# Patient Record
Sex: Male | Born: 1940 | Race: Black or African American | Hispanic: No | Marital: Married | Smoking: Former smoker
Health system: Southern US, Community
[De-identification: ages and names within clinical notes are randomized; demographics above are authoritative.]

## PROBLEM LIST (undated history)

## (undated) DIAGNOSIS — D696 Thrombocytopenia, unspecified: Secondary | ICD-10-CM

## (undated) DIAGNOSIS — E785 Hyperlipidemia, unspecified: Secondary | ICD-10-CM

## (undated) DIAGNOSIS — I10 Essential (primary) hypertension: Secondary | ICD-10-CM

## (undated) DIAGNOSIS — I2699 Other pulmonary embolism without acute cor pulmonale: Secondary | ICD-10-CM

## (undated) HISTORY — DX: Thrombocytopenia, unspecified: D69.6

## (undated) HISTORY — DX: Hyperlipidemia, unspecified: E78.5

## (undated) HISTORY — DX: Other pulmonary embolism without acute cor pulmonale: I26.99

## (undated) HISTORY — DX: Essential (primary) hypertension: I10

---

## 2021-01-29 ENCOUNTER — Other Ambulatory Visit: Payer: Self-pay

## 2021-01-29 ENCOUNTER — Inpatient Hospital Stay (HOSPITAL_COMMUNITY)
Admission: EM | Admit: 2021-01-29 | Discharge: 2021-02-03 | DRG: 175 | Disposition: A | Discharge status: Home / Self Care | Payer: Self-pay | Attending: Student in an Organized Health Care Education/Training Program | Admitting: Student in an Organized Health Care Education/Training Program

## 2021-01-29 ENCOUNTER — Encounter (HOSPITAL_COMMUNITY): Payer: Self-pay | Admitting: Emergency Medicine

## 2021-01-29 ENCOUNTER — Emergency Department (HOSPITAL_COMMUNITY): Payer: Self-pay

## 2021-01-29 DIAGNOSIS — G40909 Epilepsy, unspecified, not intractable, without status epilepticus: Secondary | ICD-10-CM | POA: Diagnosis present

## 2021-01-29 DIAGNOSIS — W109XXA Fall (on) (from) unspecified stairs and steps, initial encounter: Secondary | ICD-10-CM | POA: Diagnosis present

## 2021-01-29 DIAGNOSIS — Z79899 Other long term (current) drug therapy: Secondary | ICD-10-CM

## 2021-01-29 DIAGNOSIS — N179 Acute kidney failure, unspecified: Secondary | ICD-10-CM | POA: Diagnosis present

## 2021-01-29 DIAGNOSIS — M7989 Other specified soft tissue disorders: Secondary | ICD-10-CM | POA: Diagnosis present

## 2021-01-29 DIAGNOSIS — I10 Essential (primary) hypertension: Secondary | ICD-10-CM | POA: Diagnosis present

## 2021-01-29 DIAGNOSIS — B9789 Other viral agents as the cause of diseases classified elsewhere: Secondary | ICD-10-CM | POA: Diagnosis present

## 2021-01-29 DIAGNOSIS — B348 Other viral infections of unspecified site: Secondary | ICD-10-CM | POA: Diagnosis present

## 2021-01-29 DIAGNOSIS — R451 Restlessness and agitation: Secondary | ICD-10-CM | POA: Diagnosis not present

## 2021-01-29 DIAGNOSIS — Z20822 Contact with and (suspected) exposure to covid-19: Secondary | ICD-10-CM | POA: Diagnosis present

## 2021-01-29 DIAGNOSIS — D696 Thrombocytopenia, unspecified: Secondary | ICD-10-CM | POA: Diagnosis present

## 2021-01-29 DIAGNOSIS — M25561 Pain in right knee: Secondary | ICD-10-CM | POA: Diagnosis present

## 2021-01-29 DIAGNOSIS — I071 Rheumatic tricuspid insufficiency: Secondary | ICD-10-CM | POA: Diagnosis present

## 2021-01-29 DIAGNOSIS — I2609 Other pulmonary embolism with acute cor pulmonale: Principal | ICD-10-CM | POA: Diagnosis present

## 2021-01-29 DIAGNOSIS — I2699 Other pulmonary embolism without acute cor pulmonale: Secondary | ICD-10-CM | POA: Diagnosis present

## 2021-01-29 DIAGNOSIS — G9340 Encephalopathy, unspecified: Secondary | ICD-10-CM | POA: Diagnosis present

## 2021-01-29 MED ORDER — NITROGLYCERIN 0.4 MG SL SUBL: 0.4000 mg | SUBLINGUAL_TABLET | SUBLINGUAL | Status: DC | PRN

## 2021-01-29 NOTE — ED Provider Notes (Signed)
[facility] EMERGENCY DEPARTMENT Provider Note   CSN: [id number] Arrival date & time: 01/29/21  2220     History Chief Complaint  Patient presents with   Shortness of Breath   Fall    Jeffrey Valencia is a 80 y.o. male.  The history is provided by the patient, a caregiver and medical records.  Shortness of Breath Fall Associated symptoms include shortness of breath.  Jeffrey Valencia is a 80 y.o. male who presents to the Emergency Department complaining of sob. Level V caveat due to language barrier. He presents the emergency department accompanied by a helper for evaluation of shortness of breath. He was walking down the stairs and fell down the last step. He was feeling well before he fell but as soon as he fell he started complaining of shortness of breath and requesting transport to the hospital. He does take medication but is unsure what it is or what it's for. Denies any recent illnesses. He denies any injuries from the fall.   History reviewed. No pertinent past medical history.  There are no problems to display for this patient.   History reviewed. No pertinent surgical history.     No family history on file.  Social History   Tobacco Use   Smoking status: Never   Smokeless tobacco: Never  Substance Use Topics   Drug use: Never    Home Medications Prior to Admission medications   Not on File    Allergies    Patient has no known allergies.  Review of Systems   Review of Systems  Respiratory:  Positive for shortness of breath.   All other systems reviewed and are negative.  Physical Exam Updated Vital Signs BP 128/86   Pulse 73   Temp 98.8 F (37.1 C) (Oral)   Resp 18   Ht 5\' 7"  (1.702 m)   Wt 81.6 kg   SpO2 94%   BMI 28.19 kg/m   Physical Exam Vitals and nursing note reviewed.  Constitutional:      Appearance: He is well-developed.  HENT:     Head: Normocephalic and atraumatic.  Cardiovascular:     Rate and Rhythm: Normal  rate and regular rhythm.     Heart sounds: No murmur heard. Pulmonary:     Effort: Pulmonary effort is normal. No respiratory distress.     Breath sounds: Normal breath sounds.  Abdominal:     Palpations: Abdomen is soft.     Tenderness: There is no abdominal tenderness. There is no guarding or rebound.  Musculoskeletal:        General: No tenderness.     Comments: 2+ radial and DP pulses bilaterally.  Trace edema to BLE  Skin:    General: Skin is warm and dry.  Neurological:     Mental Status: He is alert and oriented to person, place, and time.  Psychiatric:        Behavior: Behavior normal.    ED Results / Procedures / Treatments   Labs (all labs ordered are listed, but only abnormal results are displayed) Labs Reviewed  CBC WITH DIFFERENTIAL/PLATELET - Abnormal; Notable for the following components:      Result Value   RDW 16.7 (*)    Platelets 100 (*)    nRBC 0.3 (*)    All other components within normal limits  COMPREHENSIVE METABOLIC PANEL - Abnormal; Notable for the following components:   Glucose, Bld 205 (*)    Creatinine, Ser 1.67 (*)  GFR, Estimated 42 (*)    All other components within normal limits  BRAIN NATRIURETIC PEPTIDE - Abnormal; Notable for the following components:   B Natriuretic Peptide 164.1 (*)    All other components within normal limits  D-DIMER, QUANTITATIVE - Abnormal; Notable for the following components:   D-Dimer, Quant 8.56 (*)    All other components within normal limits  I-STAT CHEM 8, ED - Abnormal; Notable for the following components:   Creatinine, Ser 1.60 (*)    Glucose, Bld 136 (*)    All other components within normal limits  TROPONIN I (HIGH SENSITIVITY) - Abnormal; Notable for the following components:   Troponin I (High Sensitivity) 247 (*)    All other components within normal limits  TROPONIN I (HIGH SENSITIVITY) - Abnormal; Notable for the following components:   Troponin I (High Sensitivity) 271 (*)    All other  components within normal limits  RESP PANEL BY RT-PCR (FLU A&B, COVID) [id number]  PROTIME-INR  HEPARIN LEVEL (UNFRACTIONATED)    EKG EKG Interpretation  Date/Time:  Thursday January 29 2021 23:00:49 EDT Ventricular Rate:  90 PR Interval:  184 QRS Duration: 80 QT Interval:  358 QTC Calculation: 437 R Axis:   71 Text Interpretation: Normal sinus rhythm Nonspecific T wave abnormality Abnormal ECG Confirmed by Tilden Fossaees, Ismar Yabut [phone]([id number]) on 01/29/2021 11:24:59 PM  Radiology CT HEAD WO CONTRAST (5MM)  Result Date: 01/30/2021 CLINICAL DATA:  Head and neck trauma EXAM: CT HEAD WITHOUT CONTRAST CT CERVICAL SPINE WITHOUT CONTRAST TECHNIQUE: Multidetector CT imaging of the head and cervical spine was performed following the standard protocol without intravenous contrast. Multiplanar CT image reconstructions of the cervical spine were also generated. COMPARISON:  None. FINDINGS: CT HEAD FINDINGS Brain: There is no mass, hemorrhage or extra-axial collection. The size and configuration of the ventricles and extra-axial CSF spaces are normal. There is hypoattenuation of the periventricular white matter, most commonly indicating chronic ischemic microangiopathy. Vascular: No abnormal hyperdensity of the major intracranial arteries or dural venous sinuses. No intracranial atherosclerosis. Skull: The visualized skull base, calvarium and extracranial soft tissues are normal. Sinuses/Orbits: No fluid levels or advanced mucosal thickening of the visualized paranasal sinuses. No mastoid or middle ear effusion. The orbits are normal. CT CERVICAL SPINE FINDINGS Alignment: No static subluxation. Facets are aligned. Occipital condyles are normally positioned. Skull base and vertebrae: No acute fracture. Soft tissues and spinal canal: No prevertebral fluid or swelling. No visible canal hematoma. Disc levels: No advanced spinal canal or neural foraminal stenosis. Upper chest: No pneumothorax, pulmonary nodule or pleural effusion.  Other: Normal visualized paraspinal cervical soft tissues. IMPRESSION: 1. Chronic ischemic microangiopathy without acute intracranial abnormality. 2. No acute fracture or static subluxation of the cervical spine. Electronically Signed   By: Deatra RobinsonKevin  Herman M.D.   On: 01/30/2021 02:29   CT Angio Chest PE W/Cm &/Or Wo Cm  Result Date: 01/30/2021 CLINICAL DATA:  Positive D-dimer EXAM: CT ANGIOGRAPHY CHEST WITH CONTRAST TECHNIQUE: Multidetector CT imaging of the chest was performed using the standard protocol during bolus administration of intravenous contrast. Multiplanar CT image reconstructions and MIPs were obtained to evaluate the vascular anatomy. CONTRAST:  75mL OMNIPAQUE IOHEXOL 350 MG/ML SOLN COMPARISON:  None. FINDINGS: Cardiovascular:  Normal heart size.  No pericardial effusion. Multiple segmental and subsegmental pulmonary artery filling defects, some being occlusive including at the bilateral upper lobes. RV to LV ratio is over 1, although possibly accentuated by hypertrophic appearing left ventricle. Dilated main pulmonary artery at 33 mm. No acute  aortic finding. Mediastinum/Nodes: Negative for adenopathy or mass Lungs/Pleura: Airspace opacity which is patchy in the left lung more than right lung, not typical of pulmonary infarct and more suspicious for superimposed pneumonia. 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 No edema or effusion. Upper Abdomen: No acute finding Musculoskeletal: No acute finding Review of the MIP images confirms the above findings. Critical Value/emergent results were called by telephone at the time of interpretation on 01/30/2021 at 6:21 am to provider [facility] , who verbally acknowledged these results. IMPRESSION: Pan lobar acute pulmonary emboli with occlusive segmental clots. CT evidence of right heart strain consistent with at least submassive (intermediate risk) PE. The presence of right heart strain has been associated with an increased risk of  morbidity and mortality. Please refer to the "PE Focused" order set in EPIC. Airspace opacity in the left lung favoring pneumonia. Electronically Signed   By: Marnee Spring M.D.   On: 01/30/2021 06:23   CT CERVICAL SPINE WO CONTRAST  Result Date: 01/30/2021 CLINICAL DATA:  Head and neck trauma EXAM: CT HEAD WITHOUT CONTRAST CT CERVICAL SPINE WITHOUT CONTRAST TECHNIQUE: Multidetector CT imaging of the head and cervical spine was performed following the standard protocol without intravenous contrast. Multiplanar CT image reconstructions of the cervical spine were also generated. COMPARISON:  None. FINDINGS: CT HEAD FINDINGS Brain: There is no mass, hemorrhage or extra-axial collection. The size and configuration of the ventricles and extra-axial CSF spaces are normal. There is hypoattenuation of the periventricular white matter, most commonly indicating chronic ischemic microangiopathy. Vascular: No abnormal hyperdensity of the major intracranial arteries or dural venous sinuses. No intracranial atherosclerosis. Skull: The visualized skull base, calvarium and extracranial soft tissues are normal. Sinuses/Orbits: No fluid levels or advanced mucosal thickening of the visualized paranasal sinuses. No mastoid or middle ear effusion. The orbits are normal. CT CERVICAL SPINE FINDINGS Alignment: No static subluxation. Facets are aligned. Occipital condyles are normally positioned. Skull base and vertebrae: No acute fracture. Soft tissues and spinal canal: No prevertebral fluid or swelling. No visible canal hematoma. Disc levels: No advanced spinal canal or neural foraminal stenosis. Upper chest: No pneumothorax, pulmonary nodule or pleural effusion. Other: Normal visualized paraspinal cervical soft tissues. IMPRESSION: 1. Chronic ischemic microangiopathy without acute intracranial abnormality. 2. No acute fracture or static subluxation of the cervical spine. Electronically Signed   By: Deatra Robinson M.D.   On:  01/30/2021 02:29   DG Chest Port 1 View  Result Date: 01/30/2021 CLINICAL DATA:  Chest pain and shortness of breath. EXAM: PORTABLE CHEST 1 VIEW COMPARISON:  None. FINDINGS: Faint left perihilar hazy density may represent mild vascular congestion or asymmetric edema versus developing infiltrate. Clinical correlation is recommended. No lobar consolidation, pleural effusion or pneumothorax. Borderline cardiomegaly. Atherosclerotic calcification of the aortic arch. No acute osseous pathology. IMPRESSION: Mild left perihilar hazy density may represent mild vascular congestion or asymmetric edema versus developing infiltrate. Electronically Signed   By: Elgie Collard M.D.   On: 01/30/2021 00:14    Procedures Procedures  CRITICAL CARE Performed by: Tilden Fossa   Total critical care time: 45 minutes  Critical care time was exclusive of separately billable procedures and treating other patients.  Critical care was necessary to treat or prevent imminent or life-threatening deterioration.  Critical care was time spent personally by me on the following activities: development of treatment plan with patient and/or surrogate as well as nursing, discussions with consultants, evaluation of patient's response to treatment, examination of patient, obtaining history from  patient or surrogate, ordering and performing treatments and interventions, ordering and review of laboratory studies, ordering and review of radiographic studies, pulse oximetry and re-evaluation of patient's condition.  Medications Ordered in ED Medications  nitroGLYCERIN (NITROSTAT) SL tablet 0.4 mg (has no administration in time range)  heparin ADULT infusion 100 units/mL (25000 units/215mL) (1,000 Units/hr Intravenous New Bag/Given 01/30/21 0606)  aspirin chewable tablet 324 mg (324 mg Oral Given 01/30/21 0607)  heparin bolus via infusion 4,000 Units (4,000 Units Intravenous Bolus from Bag 01/30/21 0606)  LORazepam (ATIVAN) injection 0.5  mg (0.5 mg Intravenous Given 01/30/21 0537)  iohexol (OMNIPAQUE) 350 MG/ML injection 75 mL (75 mLs Intravenous Contrast Given 01/30/21 0607)    ED Course  I have reviewed the triage vital signs and the nursing notes.  Pertinent labs & imaging results that were available during my care of the patient were reviewed by me and considered in my medical decision making (see chart for details).    MDM Rules/Calculators/A&P                         Pt here for evaluation of sob that started when he fell down the stairs. EKG with nonspecific changes, no priors available for comparison. Troponin's are mildly elevated. Concern for PE and a CTA was obtained. CTA demonstrates large PE with evidence of right heart strain. Patient was treated with heparin drip. Discussed with patient and friend findings of studies recommendation for mission and they are in agreement with plan. Medicine consulted formation.   Final Clinical Impression(s) / ED Diagnoses Final diagnoses:  Other acute pulmonary embolism with acute cor pulmonale [facility])    Rx / DC Orders ED Discharge Orders     None        Tilden Fossa, MD 01/30/21 0725

## 2021-01-29 NOTE — ED Triage Notes (Signed)
Pt arrive POV from home with family for c/o increase SOB for the past few days and a fall today, pta.

## 2021-01-29 NOTE — ED Provider Notes (Signed)
Emergency Medicine Provider Triage Evaluation Note  Jeffrey Valencia , a 80 y.o. male  was evaluated in triage.  Pt complains of fall.  History is provided by family due to language barrier.  The patient fell down a flight of stairs earlier today.  His son reports that there was no loss of consciousness.  The patient states that he fell because he was a very short of breath.  The son reports that he has been increasingly short of breath and breathing faster over the last 5 days.  Patient is also endorsing chest pain right knee pain, and neck pain.  He is unsure if he takes blood thinners.  Review of Systems  Positive: Chest pain, neck pain, shortness of breath, arthralgias, myalgias Negative: Fever, chills, syncope, vomiting, rash  Physical Exam  BP (!) 149/74   Pulse 88   Temp 98.8 F (37.1 C) (Oral)   Resp 18   SpO2 100%  Gen:   Awake, no distress   Resp:  Normal effort  MSK:   Moves extremities without difficulty  Other:  Diffusely tender to palpation to the right knee.  No obvious trauma noted to the head.  No crepitus or step-offs.  Mild tachypnea.  Medical Decision Making  Medically screening exam initiated at 11:05 PM.  Appropriate orders placed.  Jeffrey Valencia was informed that the remainder of the evaluation will be completed by another provider, this initial triage assessment does not replace that evaluation, and the importance of remaining in the ED until their evaluation is complete.  Patient will require further work-up and evaluation in the emergency department.  EKG with ST segment changes.  He will be taken back to a room from triage.   Frederik Pear A, PA-C 01/29/21 2310    Derwood Kaplan, MD 01/30/21 [phone]

## 2021-01-30 ENCOUNTER — Inpatient Hospital Stay (HOSPITAL_COMMUNITY): Payer: Self-pay

## 2021-01-30 ENCOUNTER — Emergency Department (HOSPITAL_COMMUNITY): Payer: Self-pay

## 2021-01-30 ENCOUNTER — Observation Stay (HOSPITAL_COMMUNITY): Payer: Self-pay

## 2021-01-30 ENCOUNTER — Encounter (HOSPITAL_COMMUNITY): Payer: Self-pay | Admitting: Student in an Organized Health Care Education/Training Program

## 2021-01-30 DIAGNOSIS — I2609 Other pulmonary embolism with acute cor pulmonale: Secondary | ICD-10-CM

## 2021-01-30 DIAGNOSIS — I10 Essential (primary) hypertension: Secondary | ICD-10-CM | POA: Diagnosis present

## 2021-01-30 DIAGNOSIS — I2699 Other pulmonary embolism without acute cor pulmonale: Secondary | ICD-10-CM | POA: Diagnosis present

## 2021-01-30 LAB — CBC WITH DIFFERENTIAL/PLATELET
Abs Immature Granulocytes: 0.04 10*3/uL (ref 0.00–0.07)
Basophils Absolute: 0 10*3/uL (ref 0.0–0.1)
Basophils Relative: 1 %
Eosinophils Absolute: 0.2 10*3/uL (ref 0.0–0.5)
Eosinophils Relative: 2 %
HCT: 40.2 % (ref 39.0–52.0)
Hemoglobin: 13.3 g/dL (ref 13.0–17.0)
Immature Granulocytes: 1 %
Lymphocytes Relative: 25 %
Lymphs Abs: 2.1 10*3/uL (ref 0.7–4.0)
MCH: 29.2 pg (ref 26.0–34.0)
MCHC: 33.1 g/dL (ref 30.0–36.0)
MCV: 88.2 fL (ref 80.0–100.0)
Monocytes Absolute: 0.8 10*3/uL (ref 0.1–1.0)
Monocytes Relative: 10 %
Neutro Abs: 5.2 10*3/uL (ref 1.7–7.7)
Neutrophils Relative %: 61 %
Platelets: 100 10*3/uL — ABNORMAL LOW (ref 150–400)
RBC: 4.56 MIL/uL (ref 4.22–5.81)
RDW: 16.7 % — ABNORMAL HIGH (ref 11.5–15.5)
WBC: 8 10*3/uL (ref 4.0–10.5)
nRBC: 0.3 % — ABNORMAL HIGH (ref 0.0–0.2)

## 2021-01-30 LAB — COMPREHENSIVE METABOLIC PANEL
ALT: 22 U/L (ref 0–44)
AST: 27 U/L (ref 15–41)
Albumin: 3.5 g/dL (ref 3.5–5.0)
Alkaline Phosphatase: 89 U/L (ref 38–126)
Anion gap: 8 (ref 5–15)
BUN: 22 mg/dL (ref 8–23)
CO2: 24 mmol/L (ref 22–32)
Calcium: 9.3 mg/dL (ref 8.9–10.3)
Chloride: 103 mmol/L (ref 98–111)
Creatinine, Ser: 1.67 mg/dL — ABNORMAL HIGH (ref 0.61–1.24)
GFR, Estimated: 42 mL/min — ABNORMAL LOW (ref >60)
Glucose, Bld: 205 mg/dL — ABNORMAL HIGH (ref 70–99)
Potassium: 4 mmol/L (ref 3.5–5.1)
Sodium: 135 mmol/L (ref 135–145)
Total Bilirubin: 0.9 mg/dL (ref 0.3–1.2)
Total Protein: 7.6 g/dL (ref 6.5–8.1)

## 2021-01-30 LAB — STREP PNEUMONIAE URINARY ANTIGEN: Strep Pneumo Urinary Antigen: NEGATIVE

## 2021-01-30 LAB — RESPIRATORY PANEL BY PCR

## 2021-01-30 LAB — ECHOCARDIOGRAM COMPLETE
AR max vel: 2.31 cm2
AV Area VTI: 2.65 cm2
AV Area mean vel: 2.44 cm2
AV Mean grad: 2 mmHg
AV Peak grad: 3.5 mmHg
Ao pk vel: 0.94 m/s
Area-P 1/2: 1.93 cm2
Height: 67 in
S' Lateral: 2.4 cm
Weight: 2880 oz

## 2021-01-30 LAB — HEPARIN LEVEL (UNFRACTIONATED)
Heparin Unfractionated: 0.53 IU/mL (ref 0.30–0.70)
Heparin Unfractionated: 0.35 IU/mL (ref 0.30–0.70)

## 2021-01-30 LAB — RESP PANEL BY RT-PCR (FLU A&B, COVID) ARPGX2
Influenza A by PCR: NEGATIVE
Influenza B by PCR: NEGATIVE
SARS Coronavirus 2 by RT PCR: NEGATIVE

## 2021-01-30 LAB — I-STAT CHEM 8, ED
BUN: 23 mg/dL (ref 8–23)
Calcium, Ion: 1.21 mmol/L (ref 1.15–1.40)
Chloride: 105 mmol/L (ref 98–111)
Creatinine, Ser: 1.6 mg/dL — ABNORMAL HIGH (ref 0.61–1.24)
Glucose, Bld: 136 mg/dL — ABNORMAL HIGH (ref 70–99)
HCT: 44 % (ref 39.0–52.0)
Hemoglobin: 15 g/dL (ref 13.0–17.0)
Potassium: 4.1 mmol/L (ref 3.5–5.1)
Sodium: 140 mmol/L (ref 135–145)
TCO2: 25 mmol/L (ref 22–32)

## 2021-01-30 LAB — URINALYSIS, ROUTINE W REFLEX MICROSCOPIC
Bilirubin Urine: NEGATIVE
Glucose, UA: NEGATIVE mg/dL
Hgb urine dipstick: NEGATIVE
Ketones, ur: NEGATIVE mg/dL
Leukocytes,Ua: NEGATIVE
Nitrite: NEGATIVE
Protein, ur: NEGATIVE mg/dL
Specific Gravity, Urine: 1.045 — ABNORMAL HIGH (ref 1.005–1.030)
pH: 7 (ref 5.0–8.0)

## 2021-01-30 LAB — PROTIME-INR
INR: 1.1 (ref 0.8–1.2)
Prothrombin Time: 13.9 seconds (ref 11.4–15.2)

## 2021-01-30 LAB — D-DIMER, QUANTITATIVE: D-Dimer, Quant: 8.56 ug/mL-FEU — ABNORMAL HIGH (ref 0.00–0.50)

## 2021-01-30 LAB — TROPONIN I (HIGH SENSITIVITY)
Troponin I (High Sensitivity): 103 ng/L (ref <18)
Troponin I (High Sensitivity): 247 ng/L (ref <18)
Troponin I (High Sensitivity): 271 ng/L (ref <18)

## 2021-01-30 LAB — PROCALCITONIN: Procalcitonin: <0.1 ng/mL

## 2021-01-30 LAB — BRAIN NATRIURETIC PEPTIDE: B Natriuretic Peptide: 164.1 pg/mL — ABNORMAL HIGH (ref 0.0–100.0)

## 2021-01-30 MED ORDER — HEPARIN BOLUS VIA INFUSION
4000.0000 [IU] | Freq: Once | INTRAVENOUS | Status: AC
  Administered 2021-01-30: 4000 [IU] via INTRAVENOUS
  Filled 2021-01-30: qty 4000

## 2021-01-30 MED ORDER — ACETAMINOPHEN 650 MG RE SUPP: 650.0000 mg | Freq: Four times a day (QID) | RECTAL | Status: DC | PRN

## 2021-01-30 MED ORDER — ACETAMINOPHEN 325 MG PO TABS
650.0000 mg | ORAL_TABLET | Freq: Four times a day (QID) | ORAL | Status: DC | PRN
  Administered 2021-01-30: 650 mg via ORAL
  Filled 2021-01-30: qty 2

## 2021-01-30 MED ORDER — ASPIRIN 81 MG PO CHEW
324.0000 mg | CHEWABLE_TABLET | Freq: Once | ORAL | Status: AC
  Administered 2021-01-30: 324 mg via ORAL
  Filled 2021-01-30: qty 4

## 2021-01-30 MED ORDER — IOHEXOL 350 MG/ML SOLN
75.0000 mL | Freq: Once | INTRAVENOUS | Status: AC | PRN
  Administered 2021-01-30: 75 mL via INTRAVENOUS

## 2021-01-30 MED ORDER — LORAZEPAM 2 MG/ML IJ SOLN: 0.5000 mg | Freq: Once | INTRAMUSCULAR | Status: DC

## 2021-01-30 MED ORDER — HALOPERIDOL LACTATE 5 MG/ML IJ SOLN
5.0000 mg | Freq: Once | INTRAMUSCULAR | Status: AC
  Administered 2021-01-30: 5 mg via INTRAMUSCULAR
  Filled 2021-01-30: qty 1

## 2021-01-30 MED ORDER — SODIUM CHLORIDE 0.9 % IV SOLN
1.0000 g | INTRAVENOUS | Status: DC
  Administered 2021-01-30: 1 g via INTRAVENOUS
  Filled 2021-01-30 (×2): qty 10

## 2021-01-30 MED ORDER — LORAZEPAM 2 MG/ML IJ SOLN
0.5000 mg | Freq: Once | INTRAMUSCULAR | Status: AC
  Administered 2021-01-30: 0.5 mg via INTRAMUSCULAR
  Filled 2021-01-30: qty 1

## 2021-01-30 MED ORDER — HEPARIN (PORCINE) 25000 UT/250ML-% IV SOLN
1300.0000 [IU]/h | INTRAVENOUS | Status: DC
  Administered 2021-01-30: 1300 [IU]/h via INTRAVENOUS
  Administered 2021-01-30: 1000 [IU]/h via INTRAVENOUS
  Administered 2021-01-31: 1300 [IU]/h via INTRAVENOUS
  Filled 2021-01-30 (×3): qty 250

## 2021-01-30 MED ORDER — SODIUM CHLORIDE 0.9 % IV SOLN
500.0000 mg | Freq: Once | INTRAVENOUS | Status: AC
  Administered 2021-01-30: 500 mg via INTRAVENOUS
  Filled 2021-01-30: qty 500

## 2021-01-30 MED ORDER — LORAZEPAM 2 MG/ML IJ SOLN
0.5000 mg | Freq: Once | INTRAMUSCULAR | Status: AC
  Administered 2021-01-30: 0.5 mg via INTRAVENOUS
  Filled 2021-01-30: qty 1

## 2021-01-30 NOTE — ED Notes (Signed)
Patient transported to ultrasound at this time.

## 2021-01-30 NOTE — ED Notes (Signed)
Pt noted to be out of bed and using urinal, O2 disconnected from wall.  Pt notified that he could not get out of bed without staff.  That it was dangerous.  Pt's son had also been notified, but stated pt did not want to stay in bed.  MD notified and is speaking with pt.  MD also notified of pt c/o bil leg pain, R greater than L.

## 2021-01-30 NOTE — ED Notes (Signed)
Pt refusing to keep oxygen on.

## 2021-01-30 NOTE — Consult Note (Signed)
Chief Complaint: SOB  Referring Physician(s): Dr. Madilyn Hookees  Supervising Physician: Gilmer MorWagner, Daneesha Quinteros  Patient Status: [facility]  History of Present Illness: Jeffrey KnudsenDaniel Valencia is a 80 y.o. male presenting [facility] ED at [facility] with worsening SOB over the past few days, and a fall today secondary to his SOB.   He was diagnosed with PE today, and currently is on heparin.  VIR was contacted to evaluate for possible intervention.   Note that nearly all of the history is established by the medical record, with some from the patient's associate in the room ("I am just helping him."), as the patient has recently arrived from [place] for personal/family reason, and speaks dialect [name].  Apparently we have a lack of translation options for this dialect.   History negative for syncope or presyncope.    CTA was performed confirming PE, involving segmental/subsegmental arteries of bilateral lungs.  The RV/LV was reported as "over 1".  The measurements provided on the CTA give ratio of 1.5.  ECHO was performed demonstrating right ventricle dysfunction with estimated right ventricle pressure of 58mmHg.  EKG without bundle block.    Trop I is elevated: 247 >> 271 BNP is elevated: 164  Vital signs in the room:   SBP 148, HR: 70-80, O2 is 97% on 2L   I cannot ascertain if he has any history of cancer, or underlying CV disease.  Without, his sPESI is 0.    He endorses chest discomfort, particularly with moving and with deep breathing. Endorses bilateral calf pain.   He did, after asking any questions, ask if we can do anything for a painful tooth at the right lower molar region.  Also, he said that he has been "urinating a lot." Denies blood and denies any pain with urination.   History reviewed. No pertinent past medical history.  History reviewed. No pertinent surgical history.  Allergies: Patient has no known allergies.  Medications: Prior to Admission medications   Not on File     No family history  on file.  Social History   Socioeconomic History   Marital status: Married    Spouse name: Not on file   Number of children: Not on file   Years of education: Not on file   Highest education level: Not on file  Occupational History   Not on file  Tobacco Use   Smoking status: Never   Smokeless tobacco: Never  Substance and Sexual Activity   Alcohol use: Not on file   Drug use: Never   Sexual activity: Never  Other Topics Concern   Not on file  Social History Narrative   Not on file   Social Determinants of Health   Financial Resource Strain: Not on file  Food Insecurity: Not on file  Transportation Needs: Not on file  Physical Activity: Not on file  Stress: Not on file  Social Connections: Not on file       Review of Systems: A 12 point ROS discussed and pertinent positives are indicated in the HPI above.  All other systems are negative.  Review of Systems  Vital Signs: BP (!) 148/96   Pulse 77   Temp 97.7 F (36.5 C) (Oral)   Resp (!) 25   Ht 5\' 7"  (1.702 m)   Wt 81.6 kg   SpO2 93%   BMI 28.19 kg/m   Physical Exam General: 80 yo male appearing stated age.  Limited english understanding, though speaks/understand some.  Well-developed, well-nourished.  No distress.  HEENT: Atraumatic, normocephalic.  Conjugate gaze, extra-ocular motor intact. Mild scleral injection. No lesions on external ears, nose, lips, or gums.  Poor dentition.  Tacky mucosa.  Neck: Symmetric with no goiter enlargement.  Chest/Lungs:  Symmetric chest with inspiration/expiration.  Breathing comfortably at this time.     Heart:  No JVD appreciated.  Abdomen:  Soft, NT/ND, with + bowel sounds.   Genito-urinary: Deferred Neurologic: Cannot assess mental status well given language barrier. Moving all 4 extremities with gross sensory intact.  Pulse Exam:  palpable CFA pules.  Extremities  warm with No wound.  No significant swelling of the LE.   Imaging: CT HEAD WO CONTRAST ( )  Result  Date: 01/30/2021 CLINICAL DATA:  Head and neck trauma EXAM: CT HEAD WITHOUT CONTRAST CT CERVICAL SPINE WITHOUT CONTRAST TECHNIQUE: Multidetector CT imaging of the head and cervical spine was performed following the standard protocol without intravenous contrast. Multiplanar CT image reconstructions of the cervical spine were also generated. COMPARISON:  None. FINDINGS: CT HEAD FINDINGS Brain: There is no mass, hemorrhage or extra-axial collection. The size and configuration of the ventricles and extra-axial CSF spaces are normal. There is hypoattenuation of the periventricular white matter, most commonly indicating chronic ischemic microangiopathy. Vascular: No abnormal hyperdensity of the major intracranial arteries or dural venous sinuses. No intracranial atherosclerosis. Skull: The visualized skull base, calvarium and extracranial soft tissues are normal. Sinuses/Orbits: No fluid levels or advanced mucosal thickening of the visualized paranasal sinuses. No mastoid or middle ear effusion. The orbits are normal. CT CERVICAL SPINE FINDINGS Alignment: No static subluxation. Facets are aligned. Occipital condyles are normally positioned. Skull base and vertebrae: No acute fracture. Soft tissues and spinal canal: No prevertebral fluid or swelling. No visible canal hematoma. Disc levels: No advanced spinal canal or neural foraminal stenosis. Upper chest: No pneumothorax, pulmonary nodule or pleural effusion. Other: Normal visualized paraspinal cervical soft tissues. IMPRESSION: 1. Chronic ischemic microangiopathy without acute intracranial abnormality. 2. No acute fracture or static subluxation of the cervical spine. Electronically Signed   By: Deatra Robinson M.D.   On: 01/30/2021 02:29   CT Angio Chest PE W/Cm &/Or Wo Cm  Result Date: 01/30/2021 CLINICAL DATA:  Positive D-dimer EXAM: CT ANGIOGRAPHY CHEST WITH CONTRAST TECHNIQUE: Multidetector CT imaging of the chest was performed using the standard protocol during  bolus administration of intravenous contrast. Multiplanar CT image reconstructions and MIPs were obtained to evaluate the vascular anatomy. CONTRAST:  65mL OMNIPAQUE IOHEXOL 350 MG/ML SOLN COMPARISON:  None. FINDINGS: Cardiovascular:  Normal heart size.  No pericardial effusion. Multiple segmental and subsegmental pulmonary artery filling defects, some being occlusive including at the bilateral upper lobes. RV to LV ratio is over 1, although possibly accentuated by hypertrophic appearing left ventricle. Dilated main pulmonary artery at 33 mm. No acute aortic finding. Mediastinum/Nodes: Negative for adenopathy or mass Lungs/Pleura: Airspace opacity which is patchy in the left lung more than right lung, not typical of pulmonary infarct and more suspicious for superimposed pneumonia. 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 No edema or effusion. Upper Abdomen: No acute finding Musculoskeletal: No acute finding Review of the MIP images confirms the above findings. Critical Value/emergent results were called by telephone at the time of interpretation on 01/30/2021 at 6:21 am to provider [facility] , who verbally acknowledged these results. IMPRESSION: Pan lobar acute pulmonary emboli with occlusive segmental clots. CT evidence of right heart strain consistent with at least submassive (intermediate risk) PE. The presence of right heart strain has been  associated with an increased risk of morbidity and mortality. Please refer to the "PE Focused" order set in EPIC. Airspace opacity in the left lung favoring pneumonia. Electronically Signed   By: Marnee Spring M.D.   On: 01/30/2021 06:23   CT CERVICAL SPINE WO CONTRAST  Result Date: 01/30/2021 CLINICAL DATA:  Head and neck trauma EXAM: CT HEAD WITHOUT CONTRAST CT CERVICAL SPINE WITHOUT CONTRAST TECHNIQUE: Multidetector CT imaging of the head and cervical spine was performed following the standard protocol without intravenous contrast. Multiplanar CT  image reconstructions of the cervical spine were also generated. COMPARISON:  None. FINDINGS: CT HEAD FINDINGS Brain: There is no mass, hemorrhage or extra-axial collection. The size and configuration of the ventricles and extra-axial CSF spaces are normal. There is hypoattenuation of the periventricular white matter, most commonly indicating chronic ischemic microangiopathy. Vascular: No abnormal hyperdensity of the major intracranial arteries or dural venous sinuses. No intracranial atherosclerosis. Skull: The visualized skull base, calvarium and extracranial soft tissues are normal. Sinuses/Orbits: No fluid levels or advanced mucosal thickening of the visualized paranasal sinuses. No mastoid or middle ear effusion. The orbits are normal. CT CERVICAL SPINE FINDINGS Alignment: No static subluxation. Facets are aligned. Occipital condyles are normally positioned. Skull base and vertebrae: No acute fracture. Soft tissues and spinal canal: No prevertebral fluid or swelling. No visible canal hematoma. Disc levels: No advanced spinal canal or neural foraminal stenosis. Upper chest: No pneumothorax, pulmonary nodule or pleural effusion. Other: Normal visualized paraspinal cervical soft tissues. IMPRESSION: 1. Chronic ischemic microangiopathy without acute intracranial abnormality. 2. No acute fracture or static subluxation of the cervical spine. Electronically Signed   By: Deatra Robinson M.D.   On: 01/30/2021 02:29   US RENAL  Result Date: 01/30/2021 CLINICAL DATA:  Acute kidney injury EXAM: RENAL / URINARY TRACT ULTRASOUND COMPLETE COMPARISON:  None. FINDINGS: Right Kidney: Renal measurements: 9.0 x 4.8 x 5.1 cm = volume: 116 mL. Echogenicity within normal limits. No mass or hydronephrosis visualized. Left Kidney: Renal measurements: 8.2 x 5.3 x 5.4 cm = volume: 122 mL. Echogenicity within normal limits. No mass or hydronephrosis visualized. Bladder: Appears normal for degree of bladder distention. Other: None.  IMPRESSION: Unremarkable sonographic evaluation of the kidneys. Electronically Signed   By: Acquanetta Belling M.D.   On: 01/30/2021 15:52   DG Chest Port 1 View  Result Date: 01/30/2021 CLINICAL DATA:  Chest pain and shortness of breath. EXAM: PORTABLE CHEST 1 VIEW COMPARISON:  None. FINDINGS: Faint left perihilar hazy density may represent mild vascular congestion or asymmetric edema versus developing infiltrate. Clinical correlation is recommended. No lobar consolidation, pleural effusion or pneumothorax. Borderline cardiomegaly. Atherosclerotic calcification of the aortic arch. No acute osseous pathology. IMPRESSION: Mild left perihilar hazy density may represent mild vascular congestion or asymmetric edema versus developing infiltrate. Electronically Signed   By: Elgie Collard M.D.   On: 01/30/2021 00:14   ECHOCARDIOGRAM COMPLETE  Result Date: 01/30/2021    ECHOCARDIOGRAM LIMITED REPORT   Patient Name:   Jeffrey Valencia Date of Exam: 01/30/2021 Medical Rec #:  [id number]    Height:       67.0 in Accession #:    [id number]   Weight:       180.0 lb Date of Birth:  07-10-1940    BSA:          1.934 m Patient Age:    80 years     BP:           132/88 mmHg Patient Gender: M  HR:           67 bpm. Exam Location:  Inpatient Procedure: 2D Echo, Cardiac Doppler and Color Doppler                        STAT ECHO Reported to: Dr Leodis Sias on 01/30/2021 11:58:00 AM. Indications:    Pulmonary embolism  History:        Patient has no prior history of Echocardiogram examinations.  Sonographer:    Ross Ludwig RDCS (AE) Referring Phys: [id number] Martina Sinner  Sonographer Comments: Suboptimal parasternal window. IMPRESSIONS  1. Left ventricular ejection fraction, by estimation, is 55 to 60%. The left ventricle has normal function. The left ventricle has no regional wall motion abnormalities.  2. 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 The right ventricular size is moderately enlarged. There is  moderately elevated pulmonary artery systolic pressure. The estimated right ventricular systolic pressure is 58.0 mmHg.  3. The mitral valve is normal in structure. Trivial mitral valve regurgitation.  4. Tricuspid valve regurgitation is mild to moderate.  5. The aortic valve is normal in structure. Aortic valve regurgitation is not visualized. No aortic stenosis is present.  6. The inferior vena cava is dilated in size with <50% respiratory variability, suggesting right atrial pressure of 15 mmHg. FINDINGS  Left Ventricle: Left ventricular ejection fraction, by estimation, is 55 to 60%. The left ventricle has normal function. The left ventricle has no regional wall motion abnormalities. The left ventricular internal cavity size was normal in size. Right Ventricle: The right ventricular size is moderately enlarged. Right vetricular wall thickness was not well visualized. 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 There is moderately elevated pulmonary artery systolic pressure. The tricuspid regurgitant velocity is 3.28 m/s, and with an assumed right atrial pressure of 15 mmHg, the estimated right ventricular systolic pressure is 58.0 mmHg. Mitral Valve: The mitral valve is normal in structure. Trivial mitral valve regurgitation. Tricuspid Valve: The tricuspid valve is grossly normal. Tricuspid valve regurgitation is mild to moderate. Aortic Valve: The aortic valve is normal in structure. Aortic valve regurgitation is not visualized. No aortic stenosis is present. Aortic valve mean gradient measures 2.0 mmHg. Aortic valve peak gradient measures 3.5 mmHg. Aortic valve area, by VTI measures 2.65 cm. Venous: The inferior vena cava is dilated in size with less than 50% respiratory variability, suggesting right atrial pressure of 15 mmHg. LEFT VENTRICLE PLAX 2D LVIDd:         3.60 cm  Diastology LVIDs:         2.40 cm  LV e' medial:    4.37 cm/s LV PW:         1.50 cm  LV E/e' medial:  9.0 LV IVS:         1.50 cm  LV e' lateral:   6.75 cm/s LVOT diam:     1.90 cm  LV E/e' lateral: 5.8 LV SV:         45 LV SV Index:   23 LVOT Area:     2.84 cm  RIGHT VENTRICLE            IVC RV Basal diam:  3.30 cm    IVC diam: 2.50 cm RV S prime:     7.00 cm/s TAPSE (M-mode): 1.8 cm LEFT ATRIUM           Index       RIGHT ATRIUM  Index LA diam:      2.50 cm 1.29 cm/m  RA Area:     11.90 cm LA Vol (A2C): 26.1 ml 13.50 ml/m RA Volume:   25.10 ml  12.98 ml/m LA Vol (A4C): 28.6 ml 14.79 ml/m  AORTIC VALVE AV Area (Vmax):    2.31 cm AV Area (Vmean):   2.44 cm AV Area (VTI):     2.65 cm AV Vmax:           94.10 cm/s AV Vmean:          66.800 cm/s AV VTI:            0.170 m AV Peak Grad:      3.5 mmHg AV Mean Grad:      2.0 mmHg LVOT Vmax:         76.80 cm/s LVOT Vmean:        57.500 cm/s LVOT VTI:          0.159 m LVOT/AV VTI ratio: 0.94  AORTA Ao Root diam: 3.20 cm Ao Asc diam:  3.00 cm MITRAL VALVE               TRICUSPID VALVE MV Area (PHT): 1.93 cm    TR Peak grad:   43.0 mmHg MV Decel Time: 394 msec    TR Vmax:        328.00 cm/s MV E velocity: 39.30 cm/s MV A velocity: 57.80 cm/s  SHUNTS MV E/A ratio:  0.68        Systemic VTI:  0.16 m                            Systemic Diam: 1.90 cm Kristeen Miss MD Electronically signed by Kristeen Miss MD Signature Date/Time: 01/30/2021/1:44:55 PM    Final     Labs:  CBC: Recent Labs    01/29/21 2314 01/30/21 0059  WBC 8.0  --   HGB 13.3 15.0  HCT 40.2 44.0  PLT 100*  --     COAGS: Recent Labs    01/30/21 0256  INR 1.1    BMP: Recent Labs    01/29/21 2314 01/30/21 0059  NA 135 140  K 4.0 4.1  CL 103 105  CO2 24  --   GLUCOSE 205* 136*  BUN 22 23  CALCIUM 9.3  --   CREATININE 1.67* 1.60*  GFRNONAA 42*  --     LIVER FUNCTION TESTS: Recent Labs    01/29/21 2314  BILITOT 0.9  AST 27  ALT 22  ALKPHOS 89  PROT 7.6  ALBUMIN 3.5    TUMOR MARKERS: No results for input(s): AFPTM, CEA, CA199, CHROMGRNA in the last 8760 hours.  Assessment  and Plan:  Jeffrey Valencia is a 80 yo male, [place] national, speaking dialect of [name], with acute PE and SOB.   His category is submassive, high risk given his biomarkers, CT findings of RV/LV ratio reversal, and the ECHO.    The burden of PE is segmental/subsegmental, so relatively distal.  He is successfully being treated with heparin.    His language barrier makes it difficult to communicate the pertinent anatomy, physiology/pathophysiology, natural history of VTE, and it is impossible to have a meaningful conversation regarding an informed consent for any possible catheter directed treatment of the PE.  Options here might include catheter directed lysis and/or mechanical thrombectomy.    Given the inherent risk of the procedure, I think he would need  to have a reasonable informed consent before we proceed at this time considering his normal vital signs.    We will follow, and are available should his condition deteriorate or not improve, such that we need to reconsider.  For know we agree with anticoagulation strategy.    Plan: - No intervention indicated at this time.  Consent not achieved given the significant language barrier - agree with anticoagulation - lower extremity DVT duplex - I think it is reasonable to offer a liquid diet, with NPO past midnight again   Thank you for this interesting consult.  I greatly enjoyed meeting Jeffrey Valencia and look forward to participating in their care.  A copy of this report was sent to the requesting provider on this date.  Electronically Signed: Gilmer Mor, DO 01/30/2021, 4:56 PM   I spent a total of 80 Minutes    in face to face in clinical consultation, greater than 50% of which was counseling/coordinating care for acute PE, possible catheter directed treatment

## 2021-01-30 NOTE — Progress Notes (Signed)
ANTICOAGULATION CONSULT NOTE  Pharmacy Consult for heparin Indication: chest pain/ACS>>PE  No Known Allergies  Patient Measurements: Height: 5\' 7"  (170.2 cm) Weight: 81.6 kg (180 lb) IBW/kg (Calculated) : 66.1 Heparin Dosing Weight: 81.6 kg  Vital Signs: BP: 130/98 (08/05 1300) Pulse Rate: 65 (08/05 1300)  Labs: Recent Labs    01/29/21 2314 01/30/21 0045 01/30/21 0059 01/30/21 0256 01/30/21 1457  HGB 13.3  --  15.0  --   --   HCT 40.2  --  44.0  --   --   PLT 100*  --   --   --   --   LABPROT  --   --   --  13.9  --   INR  --   --   --  1.1  --   HEPARINUNFRC  --   --   --   --  0.53  CREATININE 1.67*  --  1.60*  --   --   TROPONINIHS 247* 271*  --   --   --     Estimated Creatinine Clearance: 38.9 mL/min (A) (by C-G formula based on SCr of 1.6 mg/dL (H)).  Assessment: 77 yom presented to the ED with SOB. Initially started on IV heparin for ACS but now found to have a large PE with right heart strain. Hgb is WNL but platelets are low at 100.  Heparin level returned at 0.53.  Goal of Therapy:  Heparin level 0.3-0.7 units/ml Monitor platelets by anticoagulation protocol: Yes   Plan:  Since HL is therapeutic, continue current heparin infusion at 1300 units/hr Obtain confirmatory HL in 6-8 hours and can transition to daily HL afterwards if remains therapeutic  72, PharmD, [facility] Emergency Medicine Clinical Pharmacist ED RPh Phone: [phone] Main RX: [phone]

## 2021-01-30 NOTE — Progress Notes (Signed)
  Echocardiogram 2D Echocardiogram has been performed.  Jeffrey Valencia 01/30/2021, 11:56 AM

## 2021-01-30 NOTE — Consult Note (Signed)
NAME:  Jeffrey Valencia, MRN:  [id number], DOB:  [birth year], LOS: 0 ADMISSION DATE:  01/29/2021, CONSULTATION DATE:  01/30/2021 REFERRING MD:  Dr. Oswaldo Done, CHIEF COMPLAINT:  Submassive PE   History of Present Illness:  Jeffrey Valencia is a 80 y.o. male with no reported PMH who presented to the ED after suffering a fall. Patient reports that fall was secondary to shortness of breath. Son states shortness of breath and tachypnea began 5 days prior to admission. Patient also report chest and right knee pain.   On admission vital signs stable with only mild hypertension seen. Lab work only significant for creatinine 1.67, glucose 205, and platelets 100. Given concern for possible PE CTA chest was obtained and positive for Pan lobar acute PE with occlusive segmental clots, there is also CT evidence of right heart strain consistent. PCCM consulted for pulmonary consult.   Pertinent Medical History:  None   Significant Hospital Events:  01/30/21 Admitted with CTA positive for PE   Interim History / Subjective:  Seen sitting up in ED stretcher with no complaints, language barrie is present   Objective   Blood pressure 132/88, pulse 81, temperature 98.8 F (37.1 C), temperature source Oral, resp. rate 19, height 5\' 7"  (1.702 m), weight 81.6 kg, SpO2 95 %.    FiO2 (%):  [2 %] 2 %  No intake or output data in the 24 hours ending 01/30/21 1044 Filed Weights   01/30/21 0340  Weight: 81.6 kg   Examination: General: Well appearing elderly male sitting up on ED stretcher, in NAD HEENT: [place]/AT, MM pink/moist, PERRL,  Neuro: Alert but unable to determine full orientation due to language barrier  CV: s1s2 regular rate and rhythm, no murmur, rubs, or gallops,  PULM:  Clear to ascultation, no increased work of breathing, no added breath sounds, oxygen saturations 92-94 on 2L   GI: soft, bowel sounds active in all 4 quadrants, non-tender, non-distended, tolerating TF Extremities: warm/dry, no edema  Skin: no  rashes or lesions  Pertinent Labwork/imaging    8/5 CTA chest: Pan lobar acute pulmonary emboli with occlusive segmental clots. CT evidence of right heart strain consistent with at least submassive (intermediate risk) PE. Airspace opacity in the left lung favoring pneumonia.  Resolved Hospital Problem list     Assessment & Plan:  Submassive pulmonary embolism with CT evidence of right heart strain  -CTA chest with pan lobar acute pulmonary emboli with occlusive segmental clots. CT evidence of right heart strain -Unable to determine if PE is provoked due to limited history  -PESI score 88, however history is limited  Community acquired pneumonia, POA -left lung opacity seen on CTA  P: At this time no acute indication for tPA or thrombectomy. Obtain ECHO and LE dopper , if RV functions if severely decreased patient may be a candidate for tPA.  Continue systemic heparin, goal INR >2, w/ 5 days minimal  Continue supplement oxygen Empiric Azithromycin and Ceftriaxone  Check RVP, Strep PNA, and Legionella Encourage pulmonary hygiene     Best Practice   Diet/type: Regular consistency (see orders) DVT prophylaxis: systemic heparin GI prophylaxis: PPI Lines: N/A Foley:  N/A Code Status:  full code Last date of multidisciplinary goals of care discussion: Per primary   Labs   CBC: Recent Labs  Lab 01/29/21 2314 01/30/21 0059  WBC 8.0  --   NEUTROABS 5.2  --   HGB 13.3 15.0  HCT 40.2 44.0  MCV 88.2  --   PLT 100*  --  Basic Metabolic Panel: Recent Labs  Lab 01/29/21 2314 01/30/21 0059  NA 135 140  K 4.0 4.1  CL 103 105  CO2 24  --   GLUCOSE 205* 136*  BUN 22 23  CREATININE 1.67* 1.60*  CALCIUM 9.3  --    GFR: Estimated Creatinine Clearance: 38.9 mL/min (A) (by C-G formula based on SCr of 1.6 mg/dL (H)). Recent Labs  Lab 01/29/21 2314  WBC 8.0    Liver Function Tests: Recent Labs  Lab 01/29/21 2314  AST 27  ALT 22  ALKPHOS 89  BILITOT 0.9  PROT 7.6   ALBUMIN 3.5   No results for input(s): LIPASE, AMYLASE in the last 168 hours. No results for input(s): AMMONIA in the last 168 hours.  ABG    Component Value Date/Time   TCO2 25 01/30/2021 0059     Coagulation Profile: Recent Labs  Lab 01/30/21 0256  INR 1.1    Cardiac Enzymes: No results for input(s): CKTOTAL, CKMB, CKMBINDEX, TROPONINI in the last 168 hours.  HbA1C: No results found for: HGBA1C  CBG: No results for input(s): GLUCAP in the last 168 hours.  Review of Systems:   Please see the history of present illness. All other systems reviewed and are negative   Past Medical History:  He,  has no past medical history on file.   Surgical History:  History reviewed. No pertinent surgical history.   Social History:   reports that he has never smoked. He has never used smokeless tobacco. He reports that he does not use drugs.   Family History:  His family history is not on file.   Allergies No Known Allergies   Home Medications  Prior to Admission medications   Not on File     Critical care time: N/A  Jeffrey Valencia D. Tiburcio Pea, NP-C  Pulmonary & Critical Care Personal contact information can be found on Amion  01/30/2021, 11:02 AM

## 2021-01-30 NOTE — Progress Notes (Signed)
ANTICOAGULATION CONSULT NOTE  Pharmacy Consult for heparin Indication: chest pain/ACS>>PE  No Known Allergies  Patient Measurements: Height: 5\' 7"  (170.2 cm) Weight: 81.6 kg (180 lb) IBW/kg (Calculated) : 66.1 Heparin Dosing Weight: 81.6 kg  Vital Signs: Temp: 98.8 F (37.1 C) (08/04 2233) Temp Source: Oral (08/04 2233) BP: 128/86 (08/05 0600) Pulse Rate: 73 (08/05 0600)  Labs: Recent Labs    01/29/21 2314 01/30/21 0045 01/30/21 0059 01/30/21 0256  HGB 13.3  --  15.0  --   HCT 40.2  --  44.0  --   PLT 100*  --   --   --   LABPROT  --   --   --  13.9  INR  --   --   --  1.1  CREATININE 1.67*  --  1.60*  --   TROPONINIHS 247* 271*  --   --     Estimated Creatinine Clearance: 38.9 mL/min (A) (by C-G formula based on SCr of 1.6 mg/dL (H)).  Assessment: 28 yom presented to the ED with SOB. Initially started on IV heparin for ACS but now found to have a large PE with right heart strain. Hgb is WNL but platelets are low at 100.  Goal of Therapy:  Heparin level 0.3-0.7 units/ml Monitor platelets by anticoagulation protocol: Yes   Plan:  Increase heparin gtt to 1300 units/hr Check a heparin level this afternoon Daily heparin level and CBC  Chancelor Hardrick, 72 01/30/2021,7:34 AM

## 2021-01-30 NOTE — ED Notes (Signed)
Patient requesting "something for pain."  Medicated per order

## 2021-01-30 NOTE — Progress Notes (Signed)
New admission to room 2W03. Patient is able to understand little [place]. Able to answer few questions such as his name. His friend Kathlene November at bedside. Patient is oriented to person,place and time. Patient has heparin running at 13 ml/hr and azithromycin running. Patient placed on cardiac monitor and CCMD notified. VS checked. Bed kept in low position and locked. Call bell in reach. No skin issue noted. Will continue to monitor.

## 2021-01-30 NOTE — H&P (Signed)
Date: 01/30/2021               Patient Name:  Gregroy Valencia MRN: [id number]  DOB: 1940-10-06 Age / Sex: 80 y.o., male   PCP: Pcp, No         Medical Service: Internal Medicine Teaching Service         Attending Physician: Dr. Oswaldo Done, Jeffrey Valencia, *    First Contact: Dr. Ned Card Pager: [phone]  Second Contact: Dr. Mcarthur Rossetti Pager: [phone]       After Hours (After 5p/  First Contact Pager: [phone]  weekends / holidays): Second Contact Pager: [phone]   Chief Complaint: Shortness of breathe  History of Present Illness:   Mr. Jeffrey Valencia is a 80 year old gentleman with unknown past medical history who presents to the ED with complaints of shortness of breath.  History is limited by language barrier with limited [place] proficiency and unavailability of translator.  No family at bedside to help with translation.  Jeffrey Valencia states he has been experiencing bilateral leg swelling for weeks that comes and goes. He denies any leg pain. He notes that yesterday, he began to experience sudden onset difficulty breathing.   ED Course:  On arrival to the ED, patient was noted to be mildly hypertensive at 149/74.  He was saturating at 100% on room air with respiratory rate of 18.  Patient was afebrile at 98.8.  Initial evaluation included a D-dimer that was markedly elevated at 8.56.  Due to this CTA was obtained that demonstrated pan lobar pulmonary embolisms with evidence of RV strain.  Heparin drip was initiated.  IMTS was consulted for admission.   Meds:  Unknown   Allergies: Allergies as of 01/29/2021   (No Known Allergies)   Past Medical History:  Unable to obtain due to language barrier  Family History:  Unable to obtain due to language barrier  Social History:  Unable to obtain due to language barrier  Review of Systems: A complete ROS was negative except as per HPI.   Physical Exam: Blood pressure (!) 130/98, pulse 65, temperature 98.8 F (37.1 C), temperature source Oral,  resp. rate (!) 26, height 5\' 7"  (1.702 m), weight 81.6 kg, SpO2 99 %.  Physical Exam Vitals and nursing note reviewed.  Constitutional:      General: He is not in acute distress.    Appearance: He is normal weight.  HENT:     Head: Normocephalic and atraumatic.  Eyes:     Extraocular Movements: Extraocular movements intact.     Pupils: Pupils are equal, round, and reactive to light.  Cardiovascular:     Rate and Rhythm: Normal rate and regular rhythm.     Pulses:          Radial pulses are 2+ on the right side and 2+ on the left side.     Heart sounds: No murmur heard.   Gallop present. S3 sounds present.  Pulmonary:     Effort: Pulmonary effort is normal. No respiratory distress.     Breath sounds: No wheezing, rhonchi or rales.  Abdominal:     General: Bowel sounds are normal.     Palpations: Abdomen is soft.     Tenderness: There is no abdominal tenderness.  Musculoskeletal:     Right lower leg: No edema.     Left lower leg: No edema.  Skin:    General: Skin is warm and dry.  Neurological:     General: No focal deficit present.  Mental Status: He is alert and oriented to person, place, and time.     Sensory: No sensory deficit.     Motor: No weakness.     Gait: Gait normal.  Psychiatric:        Mood and Affect: Mood is anxious.        Behavior: Behavior is cooperative.   Assessment & Plan by Problem: Active Problems:   Acute pulmonary embolism [facility])  Mr. Jeffrey Valencia is a 80 year old gentleman with unknown past medical history who presents to the ED with complaints of shortness of breath currently admitted for pulmonary embolisms.  # Acute Pulmonary Embolisms  Patient presented with acute onset shortness of breath and was found to have pan lobar PEs with evidence of RV strain and troponin leak.  Patient is hemodynamically stable and only requiring minimal oxygen, however given intermediate-high risk with PESI score of 108, I have consulted critical care for  consideration of tPA.   Etiology of PE is undetermined at this time as patient denies any prolonged immobilization.  Differential includes underlying malignancy.   - Echo ordered to evaluate right heart strain - Lower extremity duplex bilateral ordered to evaluate clot burden - Repeat troponin - Continue heparin drip - Continue supplemental oxygen to maintain oxygen saturation above 92% - Telemetry - Admit to progressive floor  # Community Acquired Pneumonia CTA with evidence of patchy airspace opacities bilaterally, with left more than right.  Discussed with critical care team, who is recommending broad-spectrum antibiotic coverage.  - Ceftriaxone 1 g daily - Azithromycin 500 mg daily - Urine Legionella and strep antigen - Respiratory viral panel - Procalcitonin  # Acute Renal Failure  No other lab work available.  Given patient's extensive pulmonary clot burden, will ultrasound the kidneys to evaluate for embolic infarction.  Differential also includes secondary to venous congestion given patient has evidence of right heart strain secondary to PE.  - Urinalysis - Renal ultrasound - Repeat BMP in the morning  # Thrombocytopenia  Mild, however will need continued monitoring given anticoagulant use.  - Daily CBC  Diet: Normal VTE: Heparin IVF: None,None Code: Full  Prior to Admission Living Arrangement:  Unknown Anticipated Discharge Location:  To be determined Barriers to Discharge: Continued medical stabilization   Dispo: Admit patient to Inpatient with expected length of stay greater than 2 midnights.  Signed: Dr. Verdene Lennert Internal Medicine PGY-3 Pager: [phone] After 5pm on weekdays and 1pm on weekends: On Call pager [phone]  01/30/2021, 2:26 PM

## 2021-01-30 NOTE — ED Notes (Signed)
Attempted report 

## 2021-01-30 NOTE — Plan of Care (Signed)

## 2021-01-30 NOTE — Progress Notes (Signed)
Interval Progress Note:   Mr. Codispoti friend Ronalee Belts is present at bedside.  Ronalee Belts explains that they had only met several days prior as they are both staying in the same area.  He witnessed the patient fall yesterday and brought him to the ED.  He witnessed the patient became short of breath while walking down the steps and lost his footing.  Upon landing on his knees, patient was struggling to breathe and panting.  They realize at this point that they both speak the same language called [name].  Ronalee Belts states he did obtain some background information from Mr. Wakeley.  Patient only came to the [place] recently for a child's college graduation.  He currently resides in [place] and has no other family in this area.  Ronalee Belts states that the patient told him he is taking Tylenol, Nifedipine and seizure medication.  Mr. Glaze cannot recall exactly when his flight to the [place] was.  When asking about symptoms prior to admission, he describes dyspnea on exertion for approximately 2 months in addition to chest pain with exertion.  Assessment/plan: Echo results demonstrate significant RV strain with severely reduced systolic function and moderate enlargement with elevated RVSP of 58.  Discussed with PCCM; recommended IR consultation.  IR has seen and evaluated patient; given difficulty with consent and current hemodynamic stability, will continue with medical management only.

## 2021-01-30 NOTE — ED Notes (Signed)
IR at bedside speaking with pt.

## 2021-01-30 NOTE — Progress Notes (Signed)
ANTICOAGULATION CONSULT NOTE - Initial Consult  Pharmacy Consult for heparin Indication: chest pain/ACS  No Known Allergies  Patient Measurements: Height: 5\' 7"  (170.2 cm) Weight: 81.6 kg (180 lb) IBW/kg (Calculated) : 66.1 Heparin Dosing Weight: 81.6 kg  Vital Signs: Temp: 98.8 F (37.1 C) (08/04 2233) Temp Source: Oral (08/04 2233) BP: 131/78 (08/05 0230) Pulse Rate: 73 (08/05 0230)  Labs: Recent Labs    01/29/21 2314 01/30/21 0045 01/30/21 0059  HGB 13.3  --  15.0  HCT 40.2  --  44.0  PLT 100*  --   --   CREATININE 1.67*  --  1.60*  TROPONINIHS 247* 271*  --     Estimated Creatinine Clearance: 38.9 mL/min (A) (by C-G formula based on SCr of 1.6 mg/dL (H)).   Medical History: History reviewed. No pertinent past medical history.  Medications:  See medication history  Assessment: 80 yo man to start heparin for CP.  He was not on anticoagulation PTA.  Hg 13.3, PTLC 100 Goal of Therapy:  Heparin level 0.3-0.7 units/ml Monitor platelets by anticoagulation protocol: Yes   Plan:  Heparin 4000 unit bolus and drip at 1000 units/hr Check heparin level 6-8 hours after start Daily HL and CBC while on heparin Monitor for bleeding complications  Jeffrey Valencia 01/30/2021,3:46 AM

## 2021-01-30 NOTE — ED Notes (Signed)
Pt noted to have decrease in oxygen saturation with good pleth. Nursing tech states pt is currently refusing to wear nasal cannula for oxygen therapy. This RN at bedside to assess pt. Pt uncooperative and continually refusing to wear oxygen, family member at bedside assisting pt with urinal. This RN will notify provider and continue to monitor pt for any changes.

## 2021-01-30 NOTE — Progress Notes (Signed)
Pt pulling off equipment and getting out of bed. Attempt to headbut and spit at staff. Telephone med on call and rec'd order for non-violent restrants as well as and order for Haldol 5mg  Im. Pt continues to attempt to get oob unassisted. Telephone call to pts contact on his chart who agreed to come in and sit with pt. Will continue to monitor for safety.

## 2021-01-30 NOTE — Progress Notes (Signed)
Lower extremity venous study attempted. Patient being moved to inpatient floor. Will attempt again as schedule and patient availability permits.   Jean Rosenthal, RDMS, RVT

## 2021-01-30 NOTE — ED Notes (Signed)
Pt currently agitated, stating that cords for cardiac monitor and oxygen tubing are confining him to bed. This RN attempted to explain to pt the purpose of each item and reassured pt that he is free to move in the bed.

## 2021-01-30 NOTE — Progress Notes (Signed)
ANTICOAGULATION CONSULT NOTE  Pharmacy Consult for heparin Indication: chest pain/ACS>>PE  No Known Allergies  Patient Measurements: Height: 5\' 7"  (170.2 cm) Weight: 81.6 kg (180 lb) IBW/kg (Calculated) : 66.1 Heparin Dosing Weight: 81.6 kg  Vital Signs: Temp: 98.7 F (37.1 C) (08/05 1800) Temp Source: Oral (08/05 1800) BP: 149/84 (08/05 1800) Pulse Rate: 74 (08/05 1800)  Labs: Recent Labs    01/29/21 2314 01/30/21 0045 01/30/21 0059 01/30/21 0256 01/30/21 1457 01/30/21 2104  HGB 13.3  --  15.0  --   --   --   HCT 40.2  --  44.0  --   --   --   PLT 100*  --   --   --   --   --   LABPROT  --   --   --  13.9  --   --   INR  --   --   --  1.1  --   --   HEPARINUNFRC  --   --   --   --  0.53 0.35  CREATININE 1.67*  --  1.60*  --   --   --   TROPONINIHS 247* 271*  --   --  103*  --     Estimated Creatinine Clearance: 38.9 mL/min (A) (by C-G formula based on SCr of 1.6 mg/dL (H)).  Assessment: 29 yom presented to the ED with SOB. Initially started on IV heparin for ACS but now found to have a large PE with right heart strain. Hgb is WNL but platelets are low at 100.  Heparin level remained therapeutic at 0.35 on 1200 units/hr but RN informs me that patient has become combative and pulled out all IV lines just now.   Goal of Therapy:  Heparin level 0.3-0.7 units/ml Monitor platelets by anticoagulation protocol: Yes   Plan:  -Informed RN to call pharmacy when new IV line has been established.  -Will hold off on checking further levels until heparin restarted   72, PharmD., BCPS, BCCCP Clinical Pharmacist Please refer to [facility] for unit-specific pharmacist

## 2021-01-31 ENCOUNTER — Telehealth: Payer: Self-pay | Admitting: Acute Care

## 2021-01-31 ENCOUNTER — Inpatient Hospital Stay (HOSPITAL_COMMUNITY): Payer: Self-pay

## 2021-01-31 DIAGNOSIS — D696 Thrombocytopenia, unspecified: Secondary | ICD-10-CM | POA: Diagnosis present

## 2021-01-31 DIAGNOSIS — I2699 Other pulmonary embolism without acute cor pulmonale: Secondary | ICD-10-CM

## 2021-01-31 DIAGNOSIS — N179 Acute kidney failure, unspecified: Secondary | ICD-10-CM | POA: Diagnosis present

## 2021-01-31 DIAGNOSIS — I2602 Saddle embolus of pulmonary artery with acute cor pulmonale: Secondary | ICD-10-CM

## 2021-01-31 DIAGNOSIS — B348 Other viral infections of unspecified site: Secondary | ICD-10-CM | POA: Diagnosis present

## 2021-01-31 LAB — BASIC METABOLIC PANEL
Anion gap: 6 (ref 5–15)
BUN: 16 mg/dL (ref 8–23)
CO2: 23 mmol/L (ref 22–32)
Calcium: 9.6 mg/dL (ref 8.9–10.3)
Chloride: 109 mmol/L (ref 98–111)
Creatinine, Ser: 1.37 mg/dL — ABNORMAL HIGH (ref 0.61–1.24)
GFR, Estimated: 53 mL/min — ABNORMAL LOW (ref >60)
Glucose, Bld: 94 mg/dL (ref 70–99)
Potassium: 5.3 mmol/L — ABNORMAL HIGH (ref 3.5–5.1)
Sodium: 138 mmol/L (ref 135–145)

## 2021-01-31 LAB — CBC
HCT: 41.2 % (ref 39.0–52.0)
Hemoglobin: 13.2 g/dL (ref 13.0–17.0)
MCH: 28.7 pg (ref 26.0–34.0)
MCHC: 32 g/dL (ref 30.0–36.0)
MCV: 89.6 fL (ref 80.0–100.0)
Platelets: 114 10*3/uL — ABNORMAL LOW (ref 150–400)
RBC: 4.6 MIL/uL (ref 4.22–5.81)
RDW: 16.8 % — ABNORMAL HIGH (ref 11.5–15.5)
WBC: 7.1 10*3/uL (ref 4.0–10.5)
nRBC: 0 % (ref 0.0–0.2)

## 2021-01-31 LAB — HEPARIN LEVEL (UNFRACTIONATED)
Heparin Unfractionated: 0.63 IU/mL (ref 0.30–0.70)
Heparin Unfractionated: 0.54 IU/mL (ref 0.30–0.70)

## 2021-01-31 MED ORDER — LEVETIRACETAM IN NACL 500 MG/100ML IV SOLN
500.0000 mg | Freq: Two times a day (BID) | INTRAVENOUS | Status: DC
  Filled 2021-01-31: qty 100

## 2021-01-31 MED ORDER — LEVETIRACETAM ER 500 MG PO TB24
500.0000 mg | ORAL_TABLET | Freq: Every day | ORAL | Status: DC
  Administered 2021-01-31: 500 mg via ORAL
  Filled 2021-01-31 (×2): qty 1

## 2021-01-31 MED ORDER — LEVETIRACETAM IN NACL 500 MG/100ML IV SOLN: 500.0000 mg | Freq: Once | INTRAVENOUS | Status: DC

## 2021-01-31 MED ORDER — LORAZEPAM 2 MG/ML IJ SOLN: 2.0000 mg | Freq: Two times a day (BID) | INTRAMUSCULAR | Status: DC | PRN

## 2021-01-31 MED ORDER — LEVETIRACETAM 500 MG PO TABS: 500.0000 mg | ORAL_TABLET | Freq: Two times a day (BID) | ORAL | Status: DC

## 2021-01-31 NOTE — Hospital Course (Addendum)
Jeffrey Valencia is a 80 y.o. with an unknown past medical history, who presented after being found down by a bystander and admitted for acute bilateral pulmonary emboli.  #Acute Provoked Pulmonary Embolisms  Patient found to have pan lobar PEs with evidence of RV strain on CTA. Remains hemodynamically stable. No need for lytic therapy. Echo on 8/5 showed EF of 55-60% with an enlarged right ventricle and severely reduced right ventricular systolic function, he is at risk for reduced functional status as a result of this event. Heparin drip was initially started and the patient was transitioned to Eliquis 5mg  BID on 8/7. He is no longer requiring supplemental O2 at this time and has no shortness of breath. He remained stable on discharge and continued to deny any chest pain or shortness of breath. Should be on Eliquis for 3 months. TOC provided the patient with a 1 month supply.   #Rhinovirus Infection, incidental CTA incidentally noted patchy airspace opacities bilaterally, with left more than right.  Respiratory panel positive for rhinovirus. Supportive treatment.  #Acute Kidney Injury  Patient presented with Cr of 1.67, no previous labs to compare to. Urinalysis negative for any sign of infection, although specific gravity is elevated at 1.045. With the patient's extensive pulmonary clot burden, an ultrasound of the kidneys was performed to evaluate for embolic infarction. Ultrasound was remarkable. Repeat Cr improved to 1.37 on 8/6 and up to 1.57 on 8/7. It remains unclear what his baseline kidney function is at this time, however it remained stable.  #Seizure disorder: Patient had an episode of shaking reported by his friend, but not seen by medical staff. There was no clear history of seizure disorder and we continued to monitor, but held off on empiric treatment initially. As of 8/8, his home medications were verified and he was restarted on his home dose of 500mg  Keppra,   #Thrombocytopenia  Mild,  platelets 100 on presentation, however will need continued monitoring given anticoagulant use. Platelets remained stable during admission.

## 2021-01-31 NOTE — Progress Notes (Signed)
Pt has been without restraints since arrival of friend who remains at bedside with pt. Will continue to monitor and tx as indicated.

## 2021-01-31 NOTE — Progress Notes (Addendum)
HD#1 SUBJECTIVE:  Patient Summary: Jeffrey Valencia is a 80 y.o. with an unknown past medical history, who presented after being found down by a bystander and admitted for acute bilateral pulmonary emboli.   Overnight Events: Patient became agitated and tried to remove oxygen and leads overnight, was given a dose of haldol.  Interim History: This is hospital day 1 for Jeffrey Valencia who was seen and evaluated at the bedside this morning. He reports improvement in his breathing, although he does still have occasional shortness of breath. He endorses intermittent chest pain that started prior to his arrival to the ED and also notes bilateral calf pain that seems to be worse on the right. Unable to get in contact with his family still, but apparently takes nifedipine, propanolol and another medication.   OBJECTIVE:  Vital Signs: Vitals:   01/30/21 1648 01/30/21 1717 01/30/21 1800 01/31/21 0312  BP:   (!) 149/84 (!) 135/58  Pulse:   74 61  Resp:   20 (!) 26  Temp: 97.7 F (36.5 C)  98.7 F (37.1 C) 98.4 F (36.9 C)  TempSrc: Oral  Oral Oral  SpO2:  93% 95% 100%  Weight:      Height:       Supplemental O2: Nasal cannula  SpO2: 100 % O2 Flow Rate (L/min): 3 L/min FiO2 (%): (!) 2 %  Filed Weights   01/30/21 0340  Weight: 81.6 kg     Intake/Output Summary (Last 24 hours) at 01/31/2021 0277 Last data filed at 01/30/2021 1800 Gross per 24 hour  Intake 527.07 ml  Output --  Net 527.07 ml   Net IO Since Admission: 527.07 mL [01/31/21 0714]  Physical Exam: General: Well-appearing male laying in bed, more awake and alert today. No acute distress. CV: RRR. No murmurs or rubs. No LE edema Pulmonary: Lungs CTAB. Normal effort. No wheezing or rales. Abdominal: Soft, nontender, nondistended. Normal bowel sounds. Extremities: Palpable radial and DP pulses. Normal ROM. Skin: Warm and dry. No obvious rash or lesions. Neuro: A&Ox3. Moves all extremities. Normal sensation. No focal deficit. Psych:  Anxious mood and affect   Patient Lines/Drains/Airways Status     Active Line/Drains/Airways     Name Placement date Placement time Site Days   Peripheral IV 01/30/21 22 G Right Hand 01/30/21  1422  Hand  1   Peripheral IV 01/30/21 20 G Left Forearm 01/30/21  1425  Forearm  1   Peripheral IV 01/30/21 22 G 1" Anterior;Right Forearm 01/30/21  2230  Forearm  1            Pertinent Labs: CBC Latest Ref Rng & Units 01/30/2021 01/29/2021  WBC 4.0 - 10.5 K/uL - 8.0  Hemoglobin 13.0 - 17.0 g/dL 41.2 87.8  Hematocrit 67.6 - 52.0 % 44.0 40.2  Platelets 150 - 400 K/uL - 100(L)    CMP Latest Ref Rng & Units 01/30/2021 01/29/2021  Glucose 70 - 99 mg/dL 720(N) 470(J)  BUN 8 - 23 mg/dL 23 22  Creatinine 6.28 - 1.24 mg/dL 3.66(Q) 9.47(M)  Sodium 135 - 145 mmol/L 140 135  Potassium 3.5 - 5.1 mmol/L 4.1 4.0  Chloride 98 - 111 mmol/L 105 103  CO2 22 - 32 mmol/L - 24  Calcium 8.9 - 10.3 mg/dL - 9.3  Total Protein 6.5 - 8.1 g/dL - 7.6  Total Bilirubin 0.3 - 1.2 mg/dL - 0.9  Alkaline Phos 38 - 126 U/L - 89  AST 15 - 41 U/L - 27  ALT 0 -  44 U/L - 22     ASSESSMENT/PLAN:   Assessment:  Principal Problem:   Acute pulmonary embolism (HCC) Active Problems:   Hypertension   Rhinovirus infection   AKI (acute kidney injury) (HCC)   Thrombocytopenia (HCC)   Plan:  #Acute Provoked Pulmonary Embolisms  Patient found to have pan lobar PEs with evidence of RV strain on CTA. Remains hemodynamically stable. No need for lytic therapy. Echo on 8/5 showed EF of 55-60% with an enlarged right ventricle and severely reduced right ventricular systolic function, he is at risk for reduced functional status as a result of this event. - Continue heparin drip, will plan to transition to Eliqius tomorrow  -- Plan for at least 3 months of anticoagulation - Continue supplemental oxygen to maintain oxygen saturation above 92% - Telemetry -- Pulmonology follow up in 2-4 weeks   #Rhinovirus Infection,  incidental CTA incidentally noted patchy airspace opacities bilaterally, with left more than right.  Respiratory panel positive for rhinovirus. Supportive treatment.    #Acute Kidney Injury  Patient presented with Cr of 1.67, no previous labs to compare to. Urinalysis negative for any sign of infection, although specific gravity is elevated at 1.045. With the patient's extensive pulmonary clot burden, an ultrasound of the kidneys was performed to evaluate for embolic infarction. Ultrasound was remarkable. Repeat Cr improved to 1.37 today. - Continue to monitor renal function  #Shaking Episodes: Reported by his friend, but not seen by medical staff. No clear history of seizure disorder. Will monitor, but no empiric treatment for now. Will give ativan if he has another episode lasting more than 2 minutes.  #Thrombocytopenia  Mild, platelets 100 on presentation, however will need continued monitoring given anticoagulant use. Platelets are 114 today. -- Continue to monitor CBC  Best Practice: Diet: Regular diet IVF: None VTE: Heparin drip Code: Full Therapy Recs: PT/OT eval DISPO: Anticipated discharge to Home pending Medical stability.  Signature: Elza Rafter, D.O.  Internal Medicine Resident, PGY-1 [facility]  Pager: [phone] 7:14 AM, 01/31/2021   Please contact the on call pager after 5 pm and on weekends at [phone].

## 2021-01-31 NOTE — Progress Notes (Signed)
VASCULAR LAB    Bilateral lower extremity venous duplex has been performed.  See CV proc for preliminary results.   Matilynn Dacey, RVT 01/31/2021, 7:28 PM

## 2021-01-31 NOTE — Progress Notes (Signed)
ANTICOAGULATION CONSULT NOTE  Pharmacy Consult for heparin IV Indication: chest pain/ACS>>PE  No Known Allergies  Patient Measurements: Height: 5\' 7"  (170.2 cm) Weight: 81.6 kg (180 lb) IBW/kg (Calculated) : 66.1 Heparin Dosing Weight: 81.6 kg  Vital Signs: Temp: 98.4 F (36.9 C) (08/06 0312) Temp Source: Oral (08/06 0312) BP: 135/58 (08/06 0312) Pulse Rate: 61 (08/06 0312)  Labs: Recent Labs    01/29/21 2314 01/30/21 0045 01/30/21 0059 01/30/21 0256 01/30/21 1457 01/30/21 2104 01/31/21 0608  HGB 13.3  --  15.0  --   --   --  13.2  HCT 40.2  --  44.0  --   --   --  41.2  PLT 100*  --   --   --   --   --  114*  LABPROT  --   --   --  13.9  --   --   --   INR  --   --   --  1.1  --   --   --   HEPARINUNFRC  --   --   --   --  0.53 0.35 0.63  CREATININE 1.67*  --  1.60*  --   --   --   --   TROPONINIHS 247* 271*  --   --  103*  --   --     Estimated Creatinine Clearance: 38.9 mL/min (A) (by C-G formula based on SCr of 1.6 mg/dL (H)).  Assessment: 71 yom presented to the ED with SOB. Initially started on IV heparin for ACS but now found to have a large PE with right heart strain. Hgb is WNL but platelets are low ~100.  Heparin level 0.63, therapeutic   Nurse noted no issue, Heparin IV running appropriately   Goal of Therapy:  Heparin level 0.3-0.7 units/ml Monitor platelets by anticoagulation protocol: Yes  Plan:  -Continue Heparin IV 1300 units/hr  -will get additional line access to run other medications, including keppra -Obtain 6 hour heparin level -F/u daily labs, s/s of bleeding, and transition to PO therapy   Thank you for allowing pharmacy to participate in this patient's care.  70, PharmD PGY1 Acute Care Resident  01/31/2021,10:13 AM

## 2021-01-31 NOTE — Progress Notes (Addendum)
NAME:  Jeffrey Valencia, MRN:  [id number], DOB:  1941-03-29, LOS: 1 ADMISSION DATE:  01/29/2021, CONSULTATION DATE:  01/30/2021 REFERRING MD:  Dr. Oswaldo Done, CHIEF COMPLAINT:  Submassive PE   History of Present Illness:  Jeffrey Valencia is a 80 y.o. male with no reported PMH who presented to the ED after suffering a fall. Patient reports that fall was secondary to shortness of breath. Son states shortness of breath and tachypnea began 5 days prior to admission. Patient also report chest and right knee pain.   On admission vital signs stable with only mild hypertension seen. Lab work only significant for creatinine 1.67, glucose 205, and platelets 100. Given concern for possible PE CTA chest was obtained and positive for Pan lobar acute PE with occlusive segmental clots, there is also CT evidence of right heart strain consistent. PCCM consulted for pulmonary consult.   Pertinent Medical History:  None   Significant Hospital Events:  01/30/21 Admitted with CTA positive for PE   Interim History / Subjective:  Some issues with agitation and confusion overnight  No longer requiring restraints  Objective   Blood pressure (!) 135/58, pulse 61, temperature 98.4 F (36.9 C), temperature source Oral, resp. rate (!) 26, height 5\' 7"  (1.702 m), weight 81.6 kg, SpO2 100 %.        Intake/Output Summary (Last 24 hours) at 01/31/2021 0726 Last data filed at 01/30/2021 1800 Gross per 24 hour  Intake 527.07 ml  Output --  Net 527.07 ml   Filed Weights   01/30/21 0340  Weight: 81.6 kg   Examination: General: Well appearing elderly male lying in bed in NAD  HEENT: [place]/AT, MM pink/moist, PERRL,  Neuro: Alert but appears encephalopathic  CV: s1s2 regular rate and rhythm, no murmur, rubs, or gallops,  PULM:  Clear to ascultation bilaterally, no increased work of breathing  GI: soft, bowel sounds active in all 4 quadrants, non-tender, non-distended Extremities: warm/dry, no edema  Skin: no rashes or  lesions  Pertinent Labwork/imaging    8/5 CTA chest: Pan lobar acute pulmonary emboli with occlusive segmental clots. CT evidence of right heart strain consistent with at least submassive (intermediate risk) PE. Airspace opacity in the left lung favoring pneumonia.  Resolved Hospital Problem list     Assessment & Plan:  Submassive pulmonary embolism with CT evidence of right heart strain  -CTA chest with pan lobar acute pulmonary emboli with occlusive segmental clots. 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 Thank you for the opportunity to participate in this patient's care. Please contact if we can be of further assistance.   Best Practice  Diet/type: Regular consistency (see orders) DVT prophylaxis: systemic heparin GI prophylaxis: PPI Lines: N/A Foley:  N/A Code Status:  full code Last date of multidisciplinary goals of care discussion: Per primary    Critical care time: N/A  Ellysa Parrack D. Tiburcio Pea, NP-C [facility] Personal contact information can be found on Amion  01/31/2021, 7:26 AM

## 2021-01-31 NOTE — Progress Notes (Signed)
At this time pt remains free of restraints. He does continue to attempt to remove oxygen and leads, however, his friend is able to convince him to keep things in place. Will continue to monitor and tx as needed.

## 2021-01-31 NOTE — Progress Notes (Signed)
ANTICOAGULATION CONSULT NOTE  Pharmacy Consult for heparin IV Indication: chest pain/ACS>>PE  No Known Allergies  Patient Measurements: Height: 5\' 7"  (170.2 cm) Weight: 81.6 kg (180 lb) IBW/kg (Calculated) : 66.1 Heparin Dosing Weight: 81.6 kg  Vital Signs: Temp: 98.6 F (37 C) (08/06 1302) Temp Source: Oral (08/06 1302) BP: 116/60 (08/06 1302) Pulse Rate: 92 (08/06 1302)  Labs: Recent Labs    01/29/21 2314 01/30/21 0045 01/30/21 0059 01/30/21 0256 01/30/21 1457 01/30/21 1457 01/30/21 2104 01/31/21 0608 01/31/21 1334  HGB 13.3  --  15.0  --   --   --   --  13.2  --   HCT 40.2  --  44.0  --   --   --   --  41.2  --   PLT 100*  --   --   --   --   --   --  114*  --   LABPROT  --   --   --  13.9  --   --   --   --   --   INR  --   --   --  1.1  --   --   --   --   --   HEPARINUNFRC  --   --   --   --  0.53   < > 0.35 0.63 0.54  CREATININE 1.67*  --  1.60*  --   --   --   --  1.37*  --   TROPONINIHS 247* 271*  --   --  103*  --   --   --   --    < > = values in this interval not displayed.    Estimated Creatinine Clearance: 45.4 mL/min (A) (by C-G formula based on SCr of 1.37 mg/dL (H)).  Assessment: 51 yom presented to the ED with SOB. Initially started on IV heparin for ACS but now found to have a large PE with right heart strain. Hgb is WNL but platelets are low ~100.  Heparin level 0.63, therapeutic   Nurse noted no issue, Heparin IV running appropriately  Therapeutic X2, only need to trend daily heparin level   Goal of Therapy:  Heparin level 0.3-0.7 units/ml Monitor platelets by anticoagulation protocol: Yes  Plan:  -Continue Heparin IV 1300 units/hr  -F/u daily labs, s/s of bleeding, and transition to PO therapy   Thank you for allowing pharmacy to participate in this patient's care.  70, PharmD PGY1 Acute Care Resident  01/31/2021,2:42 PM

## 2021-02-01 LAB — BASIC METABOLIC PANEL
Anion gap: 8 (ref 5–15)
BUN: 24 mg/dL — ABNORMAL HIGH (ref 8–23)
CO2: 23 mmol/L (ref 22–32)
Calcium: 9.1 mg/dL (ref 8.9–10.3)
Chloride: 107 mmol/L (ref 98–111)
Creatinine, Ser: 1.57 mg/dL — ABNORMAL HIGH (ref 0.61–1.24)
GFR, Estimated: 45 mL/min — ABNORMAL LOW (ref >60)
Glucose, Bld: 90 mg/dL (ref 70–99)
Potassium: 3.9 mmol/L (ref 3.5–5.1)
Sodium: 138 mmol/L (ref 135–145)

## 2021-02-01 LAB — CBC
HCT: 36.5 % — ABNORMAL LOW (ref 39.0–52.0)
Hemoglobin: 11.7 g/dL — ABNORMAL LOW (ref 13.0–17.0)
MCH: 28.5 pg (ref 26.0–34.0)
MCHC: 32.1 g/dL (ref 30.0–36.0)
MCV: 88.8 fL (ref 80.0–100.0)
Platelets: 111 10*3/uL — ABNORMAL LOW (ref 150–400)
RBC: 4.11 MIL/uL — ABNORMAL LOW (ref 4.22–5.81)
RDW: 16.9 % — ABNORMAL HIGH (ref 11.5–15.5)
WBC: 7.1 10*3/uL (ref 4.0–10.5)
nRBC: 0 % (ref 0.0–0.2)

## 2021-02-01 LAB — HEPARIN LEVEL (UNFRACTIONATED): Heparin Unfractionated: 0.6 IU/mL (ref 0.30–0.70)

## 2021-02-01 MED ORDER — APIXABAN 5 MG PO TABS
5.0000 mg | ORAL_TABLET | Freq: Two times a day (BID) | ORAL | Status: DC
  Administered 2021-02-01 – 2021-02-03 (×5): 5 mg via ORAL
  Filled 2021-02-01 (×5): qty 1

## 2021-02-01 NOTE — Progress Notes (Signed)
HD#2 SUBJECTIVE:  Patient Summary: Jeffrey Valencia is a 80 y.o. with an unknown past medical history, who presented after being found down by a bystander and admitted for acute, provoked bilateral pulmonary emboli.  Overnight Events: No acute events overnight.  Interim History: This is hospital day 2 for Jeffrey Valencia who was seen and evaluated while sitting in his chair this morning. He feels better overall and has had no difficulty breathing. He remains on 2L [place]. No further episodes of shaking were noted by any staff or friends.   OBJECTIVE:  Vital Signs: Vitals:   02/01/21 0500 02/01/21 0600 02/01/21 0615 02/01/21 0900  BP: 116/65   130/70  Pulse:   (!) 50 70  Resp: 19 (!) 22 19 16   Temp:    97.6 F (36.4 C)  TempSrc:    Oral  SpO2:  (!) 88% 95% 100%  Weight:      Height:       Supplemental O2: Nasal Cannula SpO2: 100 % O2 Flow Rate (L/min): 2 L/min FiO2 (%): (!) 2 %  Filed Weights   01/30/21 0340  Weight: 81.6 kg     Intake/Output Summary (Last 24 hours) at 02/01/2021 1035 Last data filed at 02/01/2021 04/03/2021 Gross per 24 hour  Intake 884.76 ml  Output 1000 ml  Net -115.24 ml   Net IO Since Admission: 411.83 mL [02/01/21 1035]  Physical Exam: General: Well-appearing male sitting up in his chair, no acute distress. CV: RRR. No murmurs or rubs. No LE edema Pulmonary: Lungs CTAB. Normal effort. No wheezing or rales. Remains on 2L [place] Extremities: Palpable radial and DP pulses. Normal ROM. Skin: Warm and dry. No obvious rash or lesions. Neuro: A&Ox3. Moves all extremities. Normal sensation. No focal deficit. Psych: Normal mood and affect  Patient Lines/Drains/Airways Status     Active Line/Drains/Airways     Name Placement date Placement time Site Days   Peripheral IV 01/30/21 22 G Right Hand 01/30/21  1422  Hand  2   Peripheral IV 01/30/21 20 G Left Forearm 01/30/21  1425  Forearm  2   Peripheral IV 01/30/21 22 G 1" Anterior;Right Forearm 01/30/21  2230  Forearm  2    Peripheral IV 01/31/21 20 G 1" Posterior;Right Forearm 01/31/21  1131  Forearm  1            Pertinent Labs: CBC Latest Ref Rng & Units 02/01/2021 01/31/2021 01/30/2021  WBC 4.0 - 10.5 K/uL 7.1 7.1 -  Hemoglobin 13.0 - 17.0 g/dL 11.7(L) 13.2 15.0  Hematocrit 39.0 - 52.0 % 36.5(L) 41.2 44.0  Platelets 150 - 400 K/uL 111(L) 114(L) -    CMP Latest Ref Rng & Units 02/01/2021 01/31/2021 01/30/2021  Glucose 70 - 99 mg/dL 90 94 04/01/2021)  BUN 8 - 23 mg/dL 767(H) 16 23  Creatinine 0.61 - 1.24 mg/dL 41(P) 3.79(K) 2.40(X)  Sodium 135 - 145 mmol/L 138 138 140  Potassium 3.5 - 5.1 mmol/L 3.9 5.3(H) 4.1  Chloride 98 - 111 mmol/L 107 109 105  CO2 22 - 32 mmol/L 23 23 -  Calcium 8.9 - 10.3 mg/dL 9.1 9.6 -  Total Protein 6.5 - 8.1 g/dL - - -  Total Bilirubin 0.3 - 1.2 mg/dL - - -  Alkaline Phos 38 - 126 U/L - - -  AST 15 - 41 U/L - - -  ALT 0 - 44 U/L - - -    No results for input(s): GLUCAP in the last 72 hours.   Pertinent  Imaging: VAS US LOWER EXTREMITY VENOUS (DVT)  Result Date: 01/31/2021  Lower Venous DVT Study Patient Name:  Jeffrey Valencia  Date of Exam:   01/31/2021 Medical Rec #: [id number]     Accession #:    [id number] Date of Birth: [birth year]     Patient Gender: M Patient Age:   [age] years [facility] Procedure:      VAS US LOWER EXTREMITY VENOUS (DVT) Referring Phys: Erlinda HongUNCAN VINCENT --------------------------------------------------------------------------------  Indications: Pulmonary embolism.  Risk Factors: RSV. Comparison Study: No prior study on file Performing Technologist: Sherren Kernsandace Kanady RVS  Examination Guidelines: A complete evaluation includes B-mode imaging, spectral Doppler, color Doppler, and power Doppler as needed of all accessible portions of each vessel. Bilateral testing is considered an integral part of a complete examination. Limited examinations for reoccurring indications may be performed as noted. The reflux portion of the exam is performed with the patient  in reverse Trendelenburg.  +---------+---------------+---------+-----------+----------+-------------------+ RIGHT    CompressibilityPhasicitySpontaneityPropertiesThrombus Aging      +---------+---------------+---------+-----------+----------+-------------------+ CFV      Full                                         pulsatile waveforms +---------+---------------+---------+-----------+----------+-------------------+ SFJ      Full                                                             +---------+---------------+---------+-----------+----------+-------------------+ FV Prox  Full                                                             +---------+---------------+---------+-----------+----------+-------------------+ FV Mid   Full                                                             +---------+---------------+---------+-----------+----------+-------------------+ FV DistalFull                                                             +---------+---------------+---------+-----------+----------+-------------------+ PFV      Full                                                             +---------+---------------+---------+-----------+----------+-------------------+ POP      Full  pulsatile waveforms +---------+---------------+---------+-----------+----------+-------------------+ PTV      Full                                                             +---------+---------------+---------+-----------+----------+-------------------+ PERO     Full                                                             +---------+---------------+---------+-----------+----------+-------------------+   +---------+---------------+---------+-----------+----------+-------------------+ LEFT     CompressibilityPhasicitySpontaneityPropertiesThrombus Aging       +---------+---------------+---------+-----------+----------+-------------------+ CFV      Full                                         pulsatile waveforms +---------+---------------+---------+-----------+----------+-------------------+ SFJ      Full                                                             +---------+---------------+---------+-----------+----------+-------------------+ FV Prox  Full                                                             +---------+---------------+---------+-----------+----------+-------------------+ FV Mid   Full                                                             +---------+---------------+---------+-----------+----------+-------------------+ FV DistalFull                                                             +---------+---------------+---------+-----------+----------+-------------------+ PFV      Full                                                             +---------+---------------+---------+-----------+----------+-------------------+ POP      Full                                         pulsatile waveforms +---------+---------------+---------+-----------+----------+-------------------+ PTV      Full                                                             +---------+---------------+---------+-----------+----------+-------------------+  PERO     Full                                                             +---------+---------------+---------+-----------+----------+-------------------+    Summary: BILATERAL: - No evidence of deep vein thrombosis seen in the lower extremities, bilaterally. -No evidence of popliteal cyst, bilaterally.   *See table(s) above for measurements and observations.    Preliminary     ASSESSMENT/PLAN:  Assessment: Principal Problem:   Acute pulmonary embolism (HCC) Active Problems:   Hypertension   Rhinovirus infection   AKI (acute kidney injury) (HCC)    Thrombocytopenia (HCC)   Plan: #Acute Provoked Pulmonary Embolisms  Patient traveled from [place] to [place] recently and was found to have pan lobar PEs with evidence of RV strain on CTA. He remained hemodynamically stable and there was no need for lytic therapy. Echo on 8/5 showed EF of 55-60% with an enlarged right ventricle and severely reduced right ventricular systolic function, he is at risk for reduced functional status as a result of this event. - Discontinued heparin drip and transitioned to Eliqiuis 5mg  BID today -- Plan for at least 3 months of anticoagulation -- Ambulatory pulse oximetry pending - Continue supplemental oxygen to maintain oxygen saturation above 92% - Telemetry -- Pulmonology follow up in 2-4 weeks   #Rhinovirus Infection, incidental CTA incidentally noted patchy airspace opacities bilaterally, with left more than right.  Respiratory panel positive for rhinovirus.  - Continue supportive treatment   #Acute Kidney Injury  Patient presented with Cr of 1.67, no previous labs to compare to. Cr 1.57 this morning.  - Continue to monitor renal function  #Shaking Episodes Reported by his friend overnight on 8/5, but not seen by medical staff. No clear history of seizure disorder. Will monitor, but no empiric treatment for now. Will give ativan if he has another episode lasting more than 2 minutes.  #Thrombocytopenia  Mild, platelets 100 on presentation, however will need continued monitoring given anticoagulant use. Platelets are 111 today. -- Continue to monitor CBC  Best Practice: Diet: Regular diet IVF: None VTE: Heparin drip transitioned to therapeutic eliquis on 8/7 Code: Full Therapy Recs: None needed DISPO: Anticipated discharge to Home pending Medical stability and TOC recommendations.  Signature: 10/7, D.O.  Internal Medicine Resident, PGY-1 [facility]  Pager: [phone] 10:35 AM, 02/01/2021   Please contact  the on call pager after 5 pm and on weekends at [phone].

## 2021-02-01 NOTE — Progress Notes (Signed)
ANTICOAGULATION CONSULT NOTE  Pharmacy Consult for heparin IV Indication: chest pain/ACS>>PE  No Known Allergies  Patient Measurements: Height: 5\' 7"  (170.2 cm) Weight: 81.6 kg (180 lb) IBW/kg (Calculated) : 66.1 Heparin Dosing Weight: 81.6 kg  Vital Signs: Temp: 97.4 F (36.3 C) (08/07 0400) Temp Source: Oral (08/07 0400) BP: 116/65 (08/07 0500) Pulse Rate: 50 (08/07 0615)  Labs: Recent Labs    01/29/21 2314 01/30/21 0045 01/30/21 0059 01/30/21 0256 01/30/21 1457 01/30/21 2104 01/31/21 0608 01/31/21 1334 02/01/21 0322  HGB 13.3  --  15.0  --   --   --  13.2  --  11.7*  HCT 40.2  --  44.0  --   --   --  41.2  --  36.5*  PLT 100*  --   --   --   --   --  114*  --  111*  LABPROT  --   --   --  13.9  --   --   --   --   --   INR  --   --   --  1.1  --   --   --   --   --   HEPARINUNFRC  --   --   --   --  0.53   < > 0.63 0.54 0.60  CREATININE 1.67*  --  1.60*  --   --   --  1.37*  --  1.57*  TROPONINIHS 247* 271*  --   --  103*  --   --   --   --    < > = values in this interval not displayed.    Estimated Creatinine Clearance: 39.7 mL/min (A) (by C-G formula based on SCr of 1.57 mg/dL (H)).  Assessment: 53 yom presented to the ED with SOB. Initially started on IV heparin for ACS but now found to have a large PE with right heart strain. Hgb is WNL but platelets are improving low ~100.  Heparin level 0.60, therapeutic Nurse noted no s/s of bleeding  Per MD notes, will likely transition to Eliquis today   Goal of Therapy:  Heparin level 0.3-0.7 units/ml Monitor platelets by anticoagulation protocol: Yes  Plan:  -Continue Heparin IV 1300 units/hr  -F/u daily labs, s/s of bleeding, and transition to PO therapy   Thank you for allowing pharmacy to participate in this patient's care.  70, PharmD PGY1 Acute Care Resident  02/01/2021,7:11 AM

## 2021-02-01 NOTE — Progress Notes (Signed)
Patient not in restraints upon arrival of my shift at 1900 and is resting peacefully. No restraints utilized during my shift and restraints discontinued around 0000.

## 2021-02-01 NOTE — Progress Notes (Signed)
IR was requested for image guided PE lysis/thrombectomy on Friday 01/30/21.   Case was reviewed by Dr. Loreta Ave, who recommended anticoagulation treatment and close monitoring of hemodynamic stability rather than urgent IR intervention.   IR has been checking patient's chart and it appears that the patient is tolerating anticoagulation treatment well.  Discussed with primary team, IR will sign off.    Will delete the IR rad eval order.  Please call IR for questions and concerns.   Lynann Bologna Lenell Lama PA-C 02/01/2021 10:44 AM

## 2021-02-02 ENCOUNTER — Telehealth: Payer: Self-pay | Admitting: *Deleted

## 2021-02-02 LAB — CBC
HCT: 40.4 % (ref 39.0–52.0)
Hemoglobin: 13.2 g/dL (ref 13.0–17.0)
MCH: 28.8 pg (ref 26.0–34.0)
MCHC: 32.7 g/dL (ref 30.0–36.0)
MCV: 88.2 fL (ref 80.0–100.0)
Platelets: 134 10*3/uL — ABNORMAL LOW (ref 150–400)
RBC: 4.58 MIL/uL (ref 4.22–5.81)
RDW: 17 % — ABNORMAL HIGH (ref 11.5–15.5)
WBC: 6.2 10*3/uL (ref 4.0–10.5)
nRBC: 0 % (ref 0.0–0.2)

## 2021-02-02 LAB — BASIC METABOLIC PANEL
Anion gap: 7 (ref 5–15)
BUN: 31 mg/dL — ABNORMAL HIGH (ref 8–23)
CO2: 23 mmol/L (ref 22–32)
Calcium: 9.9 mg/dL (ref 8.9–10.3)
Chloride: 106 mmol/L (ref 98–111)
Creatinine, Ser: 1.52 mg/dL — ABNORMAL HIGH (ref 0.61–1.24)
GFR, Estimated: 47 mL/min — ABNORMAL LOW (ref >60)
Glucose, Bld: 104 mg/dL — ABNORMAL HIGH (ref 70–99)
Potassium: 4.2 mmol/L (ref 3.5–5.1)
Sodium: 136 mmol/L (ref 135–145)

## 2021-02-02 LAB — LEGIONELLA PNEUMOPHILA SEROGP 1 UR AG: L. pneumophila Serogp 1 Ur Ag: NEGATIVE

## 2021-02-02 NOTE — TOC Progression Note (Addendum)
Transition of Care [facility]) - Progression Note    Patient Details  Name: Jeffrey Valencia MRN: [id number] Date of Birth: [birth year]  Transition of Care [facility]) CM/SW Contact  Jeffrey Valencia, [place] Phone Number: 02/02/2021, 2:29 PM  Clinical Narrative:    3:30 PM  CSW received phone call from a number similar to the [employer], person on the other end asked if CSW knew anything about Jeffrey Valencia. CSW asked if he was familiar, thinking it was an embassy person, and CSW had not given any personal information about the patient. He stated, "of course, that's my father! Is he ok? Is he alive?" CSW backtracked and confirmed pt's birthday and name with pt's son who is aalso named Jeffrey Valencia. He was also able to name the hotel pt was staying at. CSW asked how he had gotten her number, as many places had been contacted, he said "you called me". CSW had not called him, possible that another person at [facility] had and given him the number. CSW advised pt that she was unable to provide medical information other than pt was alive and stable and she would have the MD call for more info. MD notified and will follow up. Son's # [phone]    CSW was consulted by medical team to attempt to locate any family that can assist with care needs. Per chart pt was brought in by bystander, his information is listed as an emergency contact, he is referred to as family in previous notes, but is not. CSW left a voicemail for Jeffrey Valencia for follow up questions, have not received a call back.   CSW spoke with police, no missing person reports have been filed at this time. CSW spoke with the hotel that is listed as pt's address, they have no record of him or the bystander. Per previous notes pt was hear seeing his child graduate from college, CSW contacted all colleges in the area, no names lined up. CSW spoke with the [place], they are based out of [place], they said they would call me back. CSW did not provide any pt information,  but they noted they would send someone to the hospital. This seemed like a very strange conversation.  CSW spoke with Jeffrey Valencia ( ID# [id number]) with translation services, who also speaks pt's language of [name]. Jeffrey Valencia noted that pt is very tangential and confused, she struggled to get information out of him. He noted no children and states he came to the states to see doctors for his blood disease. He states he knows no phone numbers or anyone here. He states he had a bag with personal items, but he believes that Jeffrey Valencia may have taken it with him. No other personal belongings were noted in the room. He continued to say if we just wait someone will come.  TOC will follow up with Jeffrey Valencia and the embassy to see if anymore information becomes available. TOC will continue to follow.        Expected Discharge Plan and Services                                                 Social Determinants of Health (SDOH) Interventions    Readmission Risk Interventions No flowsheet data found.

## 2021-02-02 NOTE — Telephone Encounter (Signed)
Hospital f/u scheduled (2-4 wks), first available was 03/10/21 for 30 min. slot with Dr. Francine Graven.  Nothing further needed.

## 2021-02-02 NOTE — Progress Notes (Signed)
Kathlene November which is patient's friend came to visit and stated the social worker or physician has been in contract with patient's son. Patient's wife Salome also accompanied Kathlene November in the visit. Patient's wife does not have phone.

## 2021-02-02 NOTE — Progress Notes (Signed)
IMTS Interval Update:   CSW was able to locate patient's family and shared their contact information with provider team.  I was able to speak with patient's son, Rithik.  He reports that his father has a history of hypertension, seizure disorder, possible dementia that was being managed by his primary care doctor in [place].  Patient has previously been in the [employer] and has a remote history of tobacco and alcohol use.  However, has not had anything to smoke or drink over the past 10 years.  He notes that the patient traveled from [place] to the [place] on 01/18/2021 to attend an event in the [place].  The patient and his wife have been staying at an extended stay hotel.  He noted that the patient and his wife were surrounded by friends at this extended stay hotel which is the reason why he left for travel.  However, appears that these friends have since moved away.  He reports that the patient is generally healthy at baseline and has not had any concerns for dyspnea or chest pain on exertion.  I discussed the diagnosis of pan lobar pulmonary embolisms with Abhishek and advised that this was likely due to his recent travel.  I did advise that the patient was currently stable; however, may require oxygen and anticoagulation on discharge.  The patient's son expressed significant distress on hearing this noting that he is only a Consulting civil engineer and this would cause significant financial burden on the family.  I advised that we would work with transitions of care to help him obtain the necessary equipment and medications for discharge planning.  Katherine expressed that Mr. Handlin he has a flight back to [place] at the end of this month.  I advised him to hold off on travel plans until hospital follow-up in the outpatient setting at which point he would be reevaluated for appropriateness of travel.    Updated medication list per son:  Keppra 500mg  nightly  Plavix 75mg  daily  Aldactone 25mg  daily  Calcium 1000U daily    , MD Internal Medicine, PGY-3 Pager # [phone]

## 2021-02-02 NOTE — Telephone Encounter (Signed)
F/u scheduled for 03/10/21, this was the first 30 min slot available (2-4 wks).  Nothing further needed.

## 2021-02-02 NOTE — Progress Notes (Signed)
  SATURATION QUALIFICATIONS: (This note is used to comply with regulatory documentation for home oxygen)  Patient Saturations on Room Air at Rest = 96%  Patient Saturations on Room Air while Ambulating = 92%  Patient Saturations on 0 Liters of oxygen while Ambulating = 91%  Please briefly explain why patient needs home oxygen:

## 2021-02-02 NOTE — Progress Notes (Addendum)
HD#3 SUBJECTIVE:  Patient Summary: Jeffrey Valencia is a 80 y.o. with an unknown past medical history, who presented after being found down by a bystander and admitted for acute, provoked bilateral pulmonary emboli.  Overnight Events: No acute events overnight.  Interim History: This is hospital day 3 for Mr. Jeffrey Valencia who was seen and evaluated while sitting at the bedside this morning. He has taken off his supplemental O2 and states that he feels good. He has no pain at this time and has had no difficulty with breathing or any shortness of breath.   OBJECTIVE:  Vital Signs: Vitals:   02/01/21 2334 02/02/21 0000 02/02/21 0325 02/02/21 0400  BP:  (!) 149/76 (!) 144/72 (!) 105/59  Pulse: 62  64 (!) 55  Resp: 18 (!) 24 (!) 24 (!) 21  Temp: 98 F (36.7 C)  97.9 F (36.6 C)   TempSrc: Oral  Oral   SpO2:   91% 90%  Weight:      Height:       Supplemental O2: Nasal Cannula SpO2: 90 % O2 Flow Rate (L/min): 2 L/min FiO2 (%): (!) 2 %  Filed Weights   01/30/21 0340  Weight: 81.6 kg     Intake/Output Summary (Last 24 hours) at 02/02/2021 0641 Last data filed at 02/01/2021 1856 Gross per 24 hour  Intake 720 ml  Output 600 ml  Net 120 ml   Net IO Since Admission: 681.83 mL [02/02/21 0641]  Physical Exam: General: Well-appearing male sitting up in his chair, no acute distress. CV: RRR. No murmurs or rubs. No LE edema Pulmonary: Lungs CTAB. Normal effort. No wheezing or rales. Off supplemental O2 Extremities: Palpable radial and DP pulses. Normal ROM. Skin: Warm and dry. No obvious rash or lesions. Neuro: A&Ox3. Moves all extremities. Normal sensation. No focal deficit. Psych: Normal mood and affect  Patient Lines/Drains/Airways Status     Active Line/Drains/Airways     Name Placement date Placement time Site Days   Peripheral IV 01/30/21 22 G Right Hand 01/30/21  1422  Hand  3   Peripheral IV 01/30/21 20 G Left Forearm 01/30/21  1425  Forearm  3   Peripheral IV 01/30/21 22 G 1"  Anterior;Right Forearm 01/30/21  2230  Forearm  3   Peripheral IV 01/31/21 20 G 1" Posterior;Right Forearm 01/31/21  1131  Forearm  2            Pertinent Labs: CBC Latest Ref Rng & Units 02/02/2021 02/01/2021 01/31/2021  WBC 4.0 - 10.5 K/uL 6.2 7.1 7.1  Hemoglobin 13.0 - 17.0 g/dL 35.3 11.7(L) 13.2  Hematocrit 39.0 - 52.0 % 40.4 36.5(L) 41.2  Platelets 150 - 400 K/uL 134(L) 111(L) 114(L)    CMP Latest Ref Rng & Units 02/02/2021 02/01/2021 01/31/2021  Glucose 70 - 99 mg/dL 299(M) 90 94  BUN 8 - 23 mg/dL 42(A) 83(M) 16  Creatinine 0.61 - 1.24 mg/dL 1.96(Q) 2.29(N) 9.89(Q)  Sodium 135 - 145 mmol/L 136 138 138  Potassium 3.5 - 5.1 mmol/L 4.2 3.9 5.3(H)  Chloride 98 - 111 mmol/L 106 107 109  CO2 22 - 32 mmol/L 23 23 23   Calcium 8.9 - 10.3 mg/dL 9.9 9.1 9.6  Total Protein 6.5 - 8.1 g/dL - - -  Total Bilirubin 0.3 - 1.2 mg/dL - - -  Alkaline Phos 38 - 126 U/L - - -  AST 15 - 41 U/L - - -  ALT 0 - 44 U/L - - -    No results  for input(s): GLUCAP in the last 72 hours.   Pertinent Imaging: No results found.  ASSESSMENT/PLAN:  Assessment: Principal Problem:   Acute pulmonary embolism (HCC) Active Problems:   Hypertension   Rhinovirus infection   AKI (acute kidney injury) (HCC)   Thrombocytopenia (HCC)   Plan: #Acute Provoked Pulmonary Embolisms  Patient traveled from [place] to [place] recently and was found to have pan lobar PEs with evidence of RV strain on CTA. He was started on a heparin drip initially and transitioned to Eliqiuis 5mg  BIDon 8/7. Symptomatically doing very well, now on room air at rest. He has no signs of RV overload on exam. Unclear impact on exertional capacity, need to get him walking and working with PT more. -- Continue Eliquis 5mg  BID -- Plan for at least 3 months of anticoagulation -- Ambulatory pulse oximetry for today pending - Telemetry -- [facility] follow up in 1-2 weeks -- [facility] consult for placement upon discharge   #Rhinovirus Infection, incidental CTA  incidentally noted patchy airspace opacities bilaterally, with left more than right.  Respiratory panel positive for rhinovirus.  - Continue supportive treatment   #Acute Kidney Injury  Patient presented with Cr of 1.67, no previous labs to compare to. Cr 1.52 this morning.  - Continue to monitor renal function  #Shaking Episodes Reported by his friend overnight on 8/5, but not seen by medical staff. No clear history of seizure disorder. Will monitor, but no empiric treatment for now. Will give ativan if he has another episode lasting more than 2 minutes. No further episodes witnessed as on 8/8.   #Thrombocytopenia  Mild, platelets 100 on presentation, however will need continued monitoring given anticoagulant use. Platelets are 134 today. -- Continue to monitor CBC  Best Practice: Diet: Regular diet IVF: None VTE: Therapeutic eliquis on 8/7 Code: Full Therapy Recs: None needed DISPO: Anticipated discharge to Home pending Medical stability and TOC recommendations.  Signature: 10/8, D.O.  Internal Medicine Resident, PGY-1 10/7 Internal Medicine Residency  Pager: [phone] 6:41 AM, 02/02/2021   Please contact the on call pager after 5 pm and on weekends at [phone].

## 2021-02-03 ENCOUNTER — Other Ambulatory Visit (HOSPITAL_COMMUNITY): Payer: Self-pay

## 2021-02-03 ENCOUNTER — Telehealth: Payer: Self-pay | Admitting: Student

## 2021-02-03 LAB — BASIC METABOLIC PANEL
Anion gap: 7 (ref 5–15)
BUN: 22 mg/dL (ref 8–23)
CO2: 24 mmol/L (ref 22–32)
Calcium: 9.5 mg/dL (ref 8.9–10.3)
Chloride: 104 mmol/L (ref 98–111)
Creatinine, Ser: 1.37 mg/dL — ABNORMAL HIGH (ref 0.61–1.24)
GFR, Estimated: 53 mL/min — ABNORMAL LOW (ref >60)
Glucose, Bld: 90 mg/dL (ref 70–99)
Potassium: 4.8 mmol/L (ref 3.5–5.1)
Sodium: 135 mmol/L (ref 135–145)

## 2021-02-03 LAB — CBC
HCT: 36.9 % — ABNORMAL LOW (ref 39.0–52.0)
Hemoglobin: 12.2 g/dL — ABNORMAL LOW (ref 13.0–17.0)
MCH: 29.1 pg (ref 26.0–34.0)
MCHC: 33.1 g/dL (ref 30.0–36.0)
MCV: 88.1 fL (ref 80.0–100.0)
Platelets: 124 10*3/uL — ABNORMAL LOW (ref 150–400)
RBC: 4.19 MIL/uL — ABNORMAL LOW (ref 4.22–5.81)
RDW: 16.9 % — ABNORMAL HIGH (ref 11.5–15.5)
WBC: 5.8 10*3/uL (ref 4.0–10.5)
nRBC: 0 % (ref 0.0–0.2)

## 2021-02-03 MED ORDER — APIXABAN 5 MG PO TABS
5.0000 mg | ORAL_TABLET | Freq: Two times a day (BID) | ORAL | 0 refills | Status: DC
  Filled 2021-02-03: qty 60, 30d supply, fill #0

## 2021-02-03 NOTE — Discharge Summary (Signed)
Name: Jeffrey Valencia MRN: [id number] DOB: [birth year] 80 y.o. PCP: Pcp, No  Date of Admission: 01/29/2021 10:30 PM Date of Discharge:  02/03/2021 Attending Physician: Dr. Oswaldo Done  DISCHARGE DIAGNOSIS:  Primary Problem: Acute pulmonary embolism [facility])   Hospital Problems: Principal Problem:   Acute pulmonary embolism (HCC) Active Problems:   Hypertension   Rhinovirus infection   AKI (acute kidney injury) (HCC)   Thrombocytopenia (HCC)    DISCHARGE MEDICATIONS:   Allergies as of 02/03/2021   No Known Allergies      Medication List     TAKE these medications    apixaban 5 MG Tabs tablet Commonly known as: ELIQUIS Take 1 tablet (5 mg total) by mouth 2 (two) times daily.   clopidogrel 75 MG tablet Commonly known as: PLAVIX Take 75 mg by mouth daily.   levETIRAcetam 500 MG 24 hr tablet Commonly known as: KEPPRA XR Take 500 mg by mouth at bedtime.   OVER THE COUNTER MEDICATION Take 1 tablet by mouth daily. [facility]   PRESCRIPTION MEDICATION Malaria medication   spironolactone 25 MG tablet Commonly known as: ALDACTONE Take 25 mg by mouth daily.        DISPOSITION AND FOLLOW-UP:  Mr.Emett Ferrone was discharged from [facility] in Stable condition. At the hospital follow up visit please address:  Follow-up Recommendations: Consults: Pulmonary medicine Medications: Eliquis  Acute Provoked Pulmonary Embolisms: Patient traveled from [place] to [place] recently and was found to have pan lobar PEs with evidence of RV strain on CTA. He was started on a heparin drip initially and transitioned to Eliqiuis 5mg  BIDon 8/7. Symptomatically doing very well, now on room air at rest. He has no signs of RV overload on exam. Unclear impact on exertional capacity, need to get him walking and working with PT more. Continue Eliquis 5mg  BID and plan for at least 3 months of anticoagulation  Follow-up Appointments: Internal Medicine Clinic August 16th at 10:15AM    HOSPITAL COURSE:  Patient Summary: Jeffrey Valencia is a 80 y.o. with an unknown past medical history, who presented after being found down by a bystander and admitted for acute bilateral pulmonary emboli.  #Acute Provoked Pulmonary Embolisms  Patient found to have pan lobar PEs with evidence of RV strain on CTA. Remains hemodynamically stable. No need for lytic therapy. Echo on 8/5 showed EF of 55-60% with an enlarged right ventricle and severely reduced right ventricular systolic function, he is at risk for reduced functional status as a result of this event. Heparin drip was initially started and the patient was transitioned to Eliquis 5mg  BID on 8/7. He is no longer requiring supplemental O2 at this time and has no shortness of breath. He remained stable on discharge and continued to deny any chest pain or shortness of breath. Should be on Eliquis for 3 months. TOC provided the patient with a 1 month supply.   #Rhinovirus Infection, incidental CTA incidentally noted patchy airspace opacities bilaterally, with left more than right.  Respiratory panel positive for rhinovirus. Supportive treatment.  #Acute Kidney Injury  Patient presented with Cr of 1.67, no previous labs to compare to. Urinalysis negative for any sign of infection, although specific gravity is elevated at 1.045. With the patient's extensive pulmonary clot burden, an ultrasound of the kidneys was performed to evaluate for embolic infarction. Ultrasound was remarkable. Repeat Cr improved to 1.37 on 8/6 and up to 1.57 on 8/7. It remains unclear what his baseline kidney function is at this time, however it  remained stable.  #Seizure disorder: Patient had an episode of shaking reported by his friend, but not seen by medical staff. There was no clear history of seizure disorder and we continued to monitor, but held off on empiric treatment initially. As of 8/8, his home medications were verified and he was restarted on his home dose of  500mg  Keppra,   #Thrombocytopenia  Mild, platelets 100 on presentation, however will need continued monitoring given anticoagulant use. Platelets remained stable during admission.   DISCHARGE INSTRUCTIONS:   Discharge Instructions     Call MD for:  difficulty breathing, headache or visual disturbances   Complete by: As directed    Call MD for:  persistant dizziness or light-headedness   Complete by: As directed    Diet - low sodium heart healthy   Complete by: As directed    Increase activity slowly   Complete by: As directed        SUBJECTIVE:  Mr. Delatte was seen and evaluated at the bedside this morning. He remains off of supplemental O2 and denies any shortness of breath or chest pain. He states that he feels well.   Discharge Vitals:   BP (!) 165/74   Pulse 95   Temp 98 F (36.7 C) (Oral)   Resp 18   Ht 5\' 7"  (1.702 m)   Wt 81.6 kg   SpO2 95%   BMI 28.19 kg/m   OBJECTIVE:  General: Well-appearing male sitting up in his chair, no acute distress. CV: RRR. No murmurs or rubs. No LE edema Pulmonary: Lungs CTAB. Normal effort. No wheezing or rales. Off supplemental O2 Extremities: Palpable radial and DP pulses. Normal ROM. Skin: Warm and dry. No obvious rash or lesions. Neuro: A&Ox3. Moves all extremities. Normal sensation. No focal deficit. Psych: Normal mood and affect  Pertinent Labs, Studies, and Procedures:  CBC Latest Ref Rng & Units 02/03/2021 02/02/2021 02/01/2021  WBC 4.0 - 10.5 K/uL 5.8 6.2 7.1  Hemoglobin 13.0 - 17.0 g/dL 12.2(L) 13.2 11.7(L)  Hematocrit 39.0 - 52.0 % 36.9(L) 40.4 36.5(L)  Platelets 150 - 400 K/uL 124(L) 134(L) 111(L)    CMP Latest Ref Rng & Units 02/03/2021 02/02/2021 02/01/2021  Glucose 70 - 99 mg/dL 90 04/04/2021) 90  BUN 8 - 23 mg/dL 22 04/03/2021) 458(K)  Creatinine 0.61 - 1.24 mg/dL 99(I) 33(A) 2.50(N)  Sodium 135 - 145 mmol/L 135 136 138  Potassium 3.5 - 5.1 mmol/L 4.8 4.2 3.9  Chloride 98 - 111 mmol/L 104 106 107  CO2 22 - 32 mmol/L 24 23 23    Calcium 8.9 - 10.3 mg/dL 9.5 9.9 9.1  Total Protein 6.5 - 8.1 g/dL - - -  Total Bilirubin 0.3 - 1.2 mg/dL - - -  Alkaline Phos 38 - 126 U/L - - -  AST 15 - 41 U/L - - -  ALT 0 - 44 U/L - - -    CT Angio Chest PE W/Cm &/Or Wo Cm  Result Date: 01/30/2021 CLINICAL DATA:  Positive D-dimer EXAM: CT ANGIOGRAPHY CHEST WITH FINDINGS: Cardiovascular:  Normal heart size.  No pericardial effusion. Multiple segmental and subsegmental pulmonary artery filling defects, some being occlusive including at the bilateral upper lobes.   ECHOCARDIOGRAM COMPLETE  Result Date: 01/30/2021    ECHOCARDIOGRAM IMPRESSIONS  1. Left ventricular ejection fraction, by estimation, is 55 to 60%. The left ventricle has normal function. The left ventricle has no regional wall motion abnormalities.  2. 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 The right ventricular size  is moderately enlarged. There is moderately elevated pulmonary artery systolic pressure. The estimated right ventricular systolic pressure is 58.0 mmHg.  3. The mitral valve is normal in structure. Trivial mitral valve regurgitation.  4. Tricuspid valve regurgitation is mild to moderate.  5. The aortic valve is normal in structure. Aortic valve regurgitation is not visualized. No aortic stenosis is present.  6. The inferior vena cava is dilated in size with <50% respiratory variability, suggesting right atrial pressure of 15 mmHg. FINDINGS  Left Ventricle: Left ventricular ejection fraction, by estimation, is 55 to 60%. The left ventricle has normal function. The left ventricle has no regional wall motion abnormalities. The left ventricular internal cavity size was normal in size.   FOLLOW-UP INSTRUCTIONS:  Thank you for allowing [place] to be part of your care. You were hospitalized for Acute pulmonary embolism (HCC).  Please follow up with the following providers: A. Internal Medicine Clinic on August 16th at 10:15AM   Please note these changes made  to your medications:   A. Medications to continue: Current Meds  Medication Sig   clopidogrel (PLAVIX) 75 MG tablet Take 75 mg by mouth daily.   levETIRAcetam (KEPPRA XR) 500 MG 24 hr tablet Take 500 mg by mouth at bedtime.   OVER THE COUNTER MEDICATION Take 1 tablet by mouth daily. [facility]   PRESCRIPTION MEDICATION Malaria medication   spironolactone (ALDACTONE) 25 MG tablet Take 25 mg by mouth daily.      B. Medications to start:  Current Facility-Administered Medications:    apixaban (ELIQUIS) tablet 5 mg, 5 mg, Oral, twice daily    Please make sure to follow up in the internal medicine clinic on the ground floor of the hospital at 10:15am on August 16th.  Please call our clinic if you have any questions or concerns, we may be able to help and keep you from a long and expensive emergency room wait. Our clinic and after hours phone number is [phone], the best time to call is Monday through Friday 9 am to 4 pm but there is always someone available 24/7 if you have an emergency. If you need medication refills please notify your pharmacy one week in advance and they will send [place] a request.     Signed: Elza Rafter, D.O.  Internal Medicine Resident, PGY-1 [facility]  Pager: [phone] 2:11 PM, 02/03/2021

## 2021-02-03 NOTE — Discharge Instructions (Signed)
FOLLOW-UP INSTRUCTIONS:  Thank you for allowing [place] to be part of your care. You were hospitalized for Acute pulmonary embolism (HCC).  Please follow up with the following providers: A. Internal Medicine Clinic on August 16th at 10:15AM   Please note these changes made to your medications:   A. Medications to continue: Current Meds  Medication Sig   clopidogrel (PLAVIX) 75 MG tablet Take 75 mg by mouth daily.   levETIRAcetam (KEPPRA XR) 500 MG 24 hr tablet Take 500 mg by mouth at bedtime.   OVER THE COUNTER MEDICATION Take 1 tablet by mouth daily. [facility]   PRESCRIPTION MEDICATION Malaria medication   spironolactone (ALDACTONE) 25 MG tablet Take 25 mg by mouth daily.      B. Medications to start:  Current Facility-Administered Medications:    apixaban (ELIQUIS) tablet 5 mg, 5 mg, Oral, twice daily    Please make sure to follow up in the internal medicine clinic on the ground floor of the hospital at 10:15am on August 16th.  Please call our clinic if you have any questions or concerns, we may be able to help and keep you from a long and expensive emergency room wait. Our clinic and after hours phone number is [phone], the best time to call is Monday through Friday 9 am to 4 pm but there is always someone available 24/7 if you have an emergency. If you need medication refills please notify your pharmacy one week in advance and they will send [place] a request.

## 2021-02-03 NOTE — Telephone Encounter (Signed)
TOC NEW HFU APPT 02/10/2021 @ 10:15 AM WITH DR NGUYEN.

## 2021-02-09 NOTE — Telephone Encounter (Signed)
TOC call - pt has an appt schedule tomorrow 02/10/21. I called phone # on chart which is [facility]; the person who answered the phone stated they do not have anyone there by the pt's name. Then I called friend, [name], no answer.

## 2021-02-10 ENCOUNTER — Ambulatory Visit (INDEPENDENT_AMBULATORY_CARE_PROVIDER_SITE_OTHER): Payer: Self-pay | Admitting: Student

## 2021-02-10 ENCOUNTER — Other Ambulatory Visit (HOSPITAL_COMMUNITY): Payer: Self-pay

## 2021-02-10 ENCOUNTER — Encounter: Payer: Self-pay | Admitting: Student

## 2021-02-10 ENCOUNTER — Other Ambulatory Visit: Payer: Self-pay

## 2021-02-10 VITALS — BP 135/73 | HR 66 | Temp 97.9°F | Ht 66.0 in | Wt 150.2 lb

## 2021-02-10 DIAGNOSIS — I10 Essential (primary) hypertension: Secondary | ICD-10-CM

## 2021-02-10 DIAGNOSIS — E785 Hyperlipidemia, unspecified: Secondary | ICD-10-CM

## 2021-02-10 DIAGNOSIS — N179 Acute kidney failure, unspecified: Secondary | ICD-10-CM

## 2021-02-10 DIAGNOSIS — I2699 Other pulmonary embolism without acute cor pulmonale: Secondary | ICD-10-CM

## 2021-02-10 DIAGNOSIS — D696 Thrombocytopenia, unspecified: Secondary | ICD-10-CM

## 2021-02-10 MED ORDER — ROSUVASTATIN CALCIUM 10 MG PO TABS
10.0000 mg | ORAL_TABLET | Freq: Every day | ORAL | 2 refills | Status: DC
  Filled 2021-02-10: qty 30, 30d supply, fill #0

## 2021-02-10 MED ORDER — RIVAROXABAN 20 MG PO TABS
20.0000 mg | ORAL_TABLET | Freq: Every day | ORAL | 1 refills | Status: DC
Start: 2021-02-10 — End: 2021-04-08
  Filled 2021-02-10: qty 30, 30d supply, fill #0

## 2021-02-10 MED ORDER — RIVAROXABAN 20 MG PO TABS
20.0000 mg | ORAL_TABLET | Freq: Every day | ORAL | 1 refills | Status: DC
  Filled 2021-02-10: qty 30, 30d supply, fill #0

## 2021-02-10 NOTE — Assessment & Plan Note (Addendum)
His home medications for blood pressure include:  Amlodipine 10 mg daily Spironolactone 25 mg daily Telmisartan 80 mg daily Torsemide 20 mg daily as needed for LE swelling  Patient has not taken any of his home medications 2 days prior to this hospitalization.  He is currently on spironolactone 25 mg daily.  Initial blood pressure 162/74.  Rechecked 133/76 and 135/73.  -Will continue spironolactone 25 mg daily at this time -Holding other blood pressure medications -Torsemide 20 mg as needed.  He denies history of heart failure or heart attack

## 2021-02-10 NOTE — Assessment & Plan Note (Signed)
Mild thrombocytopenia during this hospitalization.  Unknown previous baseline.  This could be due to either viral infection or ITP.  Last LFT was normal limits.  -Continue to monitor

## 2021-02-10 NOTE — Assessment & Plan Note (Signed)
Patient was hospitalized in August 2022 for a provoked pan lobar PE.  Patient had a long flight from [place] to [place] prior to the event.  No lytic therapy was indicated.  Patient was started on IV heparin and transition to Eliquis 5 mg twice daily for 3 months treatment course.  Today, patient states that his shortness of breath and chest pain has improved.  Patient was able to walk from the main entrance down to the clinic without difficulty.  Reports adherence to Eliquis.  Patient received 30-day supply of Eliquis after hospitalization.  Unfortunately, patient does not have any insurance.  [facility] outpatient pharmacy $4 med program only covers for Xarelto.    -Advised patient to finish his 30-day prescription of Eliquis and transition to Xarelto 20 mg daily for another 2 months.

## 2021-02-10 NOTE — Assessment & Plan Note (Signed)
His home regimen includes rosuvastatin 10 mg.  Refill sent to pharmacy.  -Will check lipid panel when he obtains insurance

## 2021-02-10 NOTE — Patient Instructions (Addendum)
Mr. Jeffrey Valencia,  It was a pleasure seeing you in the clinic today.  Here is a summary of what we talked about:  1.  Pulmonary embolism: Will switch your Eliquis to Xarelto, different type of blood thinner within the same class.  Please finish the prescription of Eliquis before starting Xarelto.  You will take 1 tablet of Xarelto 20 mg once a day.  2.  High blood pressure: Your blood pressure is within acceptable range today.  Please continue spironolactone 25 mg.  Please hold other blood pressure medication at this time.  3.  High cholesterol: I sent a prescription of the rosuvastatin 10 mg to the pharmacy.  Please return in 3 months, sooner if needed  Take care,  Dr. Cyndie Chime

## 2021-02-10 NOTE — Progress Notes (Signed)
   CC: Hospital follow-up  HPI:  [name] is a 80 y.o. with past medical history of hypertension, hyperlipidemia, seizure disorder who presented to the clinic for recent hospital follow-up.  Patient presents with his son, who helps with translating.  His son states that he is working on the paperwork for his father's residence in [place].  Patient had a PCP in [place] and was seen every 3 months.  He does not recall any history of heart attack, stroke or kidney disease.  They brought the home medications to this visit.  He is currently applying for financial assistance program.  Please see problem based charting for detail  History reviewed. No pertinent past medical history.  Review of Systems:   Per HPI  Physical Exam:  Vitals:   02/10/21 1058 02/10/21 1118 02/10/21 1134  BP: (!) 162/74 133/76 135/73  Pulse: 70 60 66  Temp: 97.9 F (36.6 C)    TempSrc: Oral    SpO2: 97%    Weight: 150 lb 3.2 oz (68.1 kg)    Height: 5\' 6"  (1.676 m)     Physical Exam Constitutional:      General: He is not in acute distress.    Appearance: He is not toxic-appearing.  HENT:     Head: Normocephalic.  Eyes:     Conjunctiva/sclera: Conjunctivae normal.  Cardiovascular:     Rate and Rhythm: Normal rate and regular rhythm.     Comments: No LE edema Pulmonary:     Effort: Pulmonary effort is normal. No respiratory distress.     Breath sounds: Normal breath sounds. No wheezing.  Musculoskeletal:        General: Normal range of motion.  Skin:    General: Skin is warm.     Coloration: Skin is not jaundiced.  Neurological:     Mental Status: He is alert.  Psychiatric:        Mood and Affect: Mood normal.        Behavior: Behavior normal.     Assessment & Plan:   See Encounters Tab for problem based charting.  Patient discussed with Dr. 

## 2021-02-10 NOTE — Assessment & Plan Note (Addendum)
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  Last BMP 1 week ago shows stable creatinine.  -Monitoring

## 2021-02-11 NOTE — Progress Notes (Signed)
Internal Medicine Clinic Attending  Case discussed with Dr. Nguyen  At the time of the visit.  We reviewed the resident's history and exam and pertinent patient test results.  I agree with the assessment, diagnosis, and plan of care documented in the resident's note. 

## 2021-03-10 ENCOUNTER — Inpatient Hospital Stay: Payer: Self-pay | Admitting: Pulmonary Disease

## 2021-03-10 NOTE — Progress Notes (Deleted)
Synopsis: Referred in September 2022 for Pulmonary Emboli  Subjective:   [name] GENDER: male DOB: December 03, 1940, MRN: [id number]   HPI  No chief complaint on file.   ***  No past medical history on file.   No family history on file.   Social History   Socioeconomic History   Marital status: Married    Spouse name: Not on file   Number of children: Not on file   Years of education: Not on file   Highest education level: Not on file  Occupational History   Not on file  Tobacco Use   Smoking status: Never   Smokeless tobacco: Never  Substance and Sexual Activity   Alcohol use: Not on file   Drug use: Never   Sexual activity: Never  Other Topics Concern   Not on file  Social History Narrative   Not on file   Social Determinants of Health   Financial Resource Strain: Not on file  Food Insecurity: Not on file  Transportation Needs: Not on file  Physical Activity: Not on file  Stress: Not on file  Social Connections: Not on file  Intimate Partner Violence: Not on file     No Known Allergies   Outpatient Medications Prior to Visit  Medication Sig Dispense Refill   levETIRAcetam (KEPPRA XR) 500 MG 24 hr tablet Take 500 mg by mouth at bedtime.     OVER THE COUNTER MEDICATION Take 1 tablet by mouth daily. [facility]     PRESCRIPTION MEDICATION Malaria medication     rivaroxaban (XARELTO) 20 MG TABS tablet Take 1 tablet (20 mg total) by mouth daily with supper. 30 tablet 1   rosuvastatin (CRESTOR) 10 MG tablet Take 1 tablet (10 mg total) by mouth daily. 30 tablet 2   spironolactone (ALDACTONE) 25 MG tablet Take 25 mg by mouth daily.     torsemide (DEMADEX) 20 MG tablet Take 20 mg by mouth daily.     No facility-administered medications prior to visit.    Review of Systems  Constitutional:  Negative for chills, fever, malaise/fatigue and weight loss.  HENT:  Negative for congestion, sinus pain and sore throat.   Eyes: Negative.    Respiratory:  Negative for cough, hemoptysis, sputum production, shortness of breath and wheezing.   Cardiovascular:  Negative for chest pain, palpitations, orthopnea, claudication and leg swelling.  Gastrointestinal:  Negative for abdominal pain, heartburn, nausea and vomiting.  Genitourinary: Negative.   Musculoskeletal:  Negative for joint pain and myalgias.  Skin:  Negative for rash.  Neurological:  Negative for weakness.  Endo/Heme/Allergies: Negative.   Psychiatric/Behavioral: Negative.      Objective:  There were no vitals filed for this visit.  Physical Exam Constitutional:      General: He is not in acute distress. HENT:     Head: Normocephalic and atraumatic.  Eyes:     Extraocular Movements: Extraocular movements intact.     Conjunctiva/sclera: Conjunctivae normal.     Pupils: Pupils are equal, round, and reactive to light.  Cardiovascular:     Rate and Rhythm: Normal rate and regular rhythm.     Pulses: Normal pulses.     Heart sounds: Normal heart sounds. No murmur heard. Abdominal:     General: Bowel sounds are normal.     Palpations: Abdomen is soft.  Musculoskeletal:     Right lower leg: No edema.     Left lower leg: No edema.  Lymphadenopathy:  Cervical: No cervical adenopathy.  Skin:    General: Skin is warm and dry.  Neurological:     General: No focal deficit present.     Mental Status: He is alert.  Psychiatric:        Mood and Affect: Mood normal.        Behavior: Behavior normal.        Thought Content: Thought content normal.        Judgment: Judgment normal.    CBC    Component Value Date/Time   WBC 5.8 02/03/2021 0243   RBC 4.19 (L) 02/03/2021 0243   HGB 12.2 (L) 02/03/2021 0243   HCT 36.9 (L) 02/03/2021 0243   PLT 124 (L) 02/03/2021 0243   MCV 88.1 02/03/2021 0243   MCH 29.1 02/03/2021 0243   MCHC 33.1 02/03/2021 0243   RDW 16.9 (H) 02/03/2021 0243   LYMPHSABS 2.1 01/29/2021 2314   MONOABS 0.8 01/29/2021 2314   EOSABS 0.2  01/29/2021 2314   BASOSABS 0.0 01/29/2021 2314   BMP Latest Ref Rng & Units 02/03/2021 02/02/2021 02/01/2021  Glucose 70 - 99 mg/dL 90 283(T) 90  BUN 8 - 23 mg/dL 22 51(V) 61(Y)  Creatinine 0.61 - 1.24 mg/dL 0.73(X) 1.06(Y) 6.94(W)  Sodium 135 - 145 mmol/L 135 136 138  Potassium 3.5 - 5.1 mmol/L 4.8 4.2 3.9  Chloride 98 - 111 mmol/L 104 106 107  CO2 22 - 32 mmol/L 24 23 23   Calcium 8.9 - 10.3 mg/dL 9.5 9.9 9.1   Chest imaging: CTA Chest 01/30/21 Multiple segmental and subsegmental pulmonary artery filling defects, some being occlusive including at the bilateral upper lobes. RV to LV ratio is over 1, although possibly accentuated by hypertrophic appearing left ventricle. Dilated main pulmonary artery at 33 mm.  Lungs/Pleura: Airspace opacity which is patchy in the left lung more than right lung, not typical of pulmonary infarct and more suspicious for superimposed pneumonia. 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 No edema or effusion.  PFT: No flowsheet data found.  Echo 01/30/21: LV EF 55-60%. RV systolic function is severely reduced. RV size is moderately enlarged. Moderately elevated PASP 04/01/21.   VAS LE 01/31/21 No signs of DVT bilaterally  Assessment & Plan:   No diagnosis found.  Discussion: ***    Current Outpatient Medications:    levETIRAcetam (KEPPRA XR) 500 MG 24 hr tablet, Take 500 mg by mouth at bedtime., Disp: , Rfl:    OVER THE COUNTER MEDICATION, Take 1 tablet by mouth daily. [facility], Disp: , Rfl:    PRESCRIPTION MEDICATION, Malaria medication, Disp: , Rfl:    rivaroxaban (XARELTO) 20 MG TABS tablet, Take 1 tablet (20 mg total) by mouth daily with supper., Disp: 30 tablet, Rfl: 1   rosuvastatin (CRESTOR) 10 MG tablet, Take 1 tablet (10 mg total) by mouth daily., Disp: 30 tablet, Rfl: 2   spironolactone (ALDACTONE) 25 MG tablet, Take 25 mg by mouth daily., Disp: , Rfl:    torsemide (DEMADEX) 20 MG tablet, Take 20 mg by mouth daily., Disp: ,  Rfl:

## 2021-04-08 ENCOUNTER — Encounter: Payer: Self-pay | Admitting: Pulmonary Disease

## 2021-04-08 ENCOUNTER — Ambulatory Visit (INDEPENDENT_AMBULATORY_CARE_PROVIDER_SITE_OTHER): Payer: Self-pay | Admitting: Pulmonary Disease

## 2021-04-08 ENCOUNTER — Other Ambulatory Visit: Payer: Self-pay

## 2021-04-08 VITALS — BP 128/70 | HR 84 | Ht 66.0 in | Wt 153.0 lb

## 2021-04-08 DIAGNOSIS — B348 Other viral infections of unspecified site: Secondary | ICD-10-CM

## 2021-04-08 DIAGNOSIS — J479 Bronchiectasis, uncomplicated: Secondary | ICD-10-CM

## 2021-04-08 DIAGNOSIS — I2609 Other pulmonary embolism with acute cor pulmonale: Secondary | ICD-10-CM

## 2021-04-08 MED ORDER — SPIRIVA RESPIMAT 2.5 MCG/ACT IN AERS: 2.0000 | INHALATION_SPRAY | Freq: Every day | RESPIRATORY_TRACT | 0 refills | Status: DC

## 2021-04-08 NOTE — Patient Instructions (Addendum)
Continue Xarelto for you pulmonary embolism  We will check pulmonary function tests at follow up visit in 1 month  We will check an Echo of your heart in 1 month  Start spiriva inhaler 2 puffs daily and monitor for any changes in your breathing.

## 2021-04-08 NOTE — Progress Notes (Signed)
Patient seen in the office today and instructed on use of Spiriva 2.21mcg.  Patient expressed understanding and demonstrated technique.  Emelia Loron [facility] 04/08/21

## 2021-04-08 NOTE — Progress Notes (Signed)
Synopsis: Referred in October 2022 for hospital follow up for PE and RSV Pneumonia  Subjective:   [name] GENDER: male DOB: 07/18/1940, MRN: [id number]  HPI  Chief Complaint  Patient presents with   Hospitalization Follow-up    HFU. Per friend, he has been having some chest tightness.    Jeffrey Valencia is a 80 year old male, former smoker with history of hyperlipidemia, hypertension and thrombocytopenia who comes to pulmonary clinic for hospital follow up of pulmonary emboli.   He was admitted 8/5 to 8/9 for progressive shortness of breath of 5 days found to have RSV pneumonia and multiple segmental and subsegmental pulmonary emboli with some of the segmental emboli being occlusive. TTE showed evidence of significant right heart strain. He was treated with heparin drip and is currently taking xarelto.   He is accompanied by a friend. He is from [place]. He is feeling much better since discharge. He continues to have some shortness of breath on exertion. He also complains of pain on his right side and numbness/tingling of his legs.   Past Medical History:  Diagnosis Date   Hyperlipidemia    Hypertension    Pulmonary embolism (HCC)    Thrombocytopenia (HCC)      History reviewed. No pertinent family history.   Social History   Socioeconomic History   Marital status: Married    Spouse name: Not on file   Number of children: Not on file   Years of education: Not on file   Highest education level: Not on file  Occupational History   Not on file  Tobacco Use   Smoking status: Former    Types: Cigarettes   Smokeless tobacco: Never  Substance and Sexual Activity   Alcohol use: Not on file   Drug use: Never   Sexual activity: Never  Other Topics Concern   Not on file  Social History Narrative   Not on file   Social Determinants of Health   Financial Resource Strain: Not on file  Food Insecurity: Not on file  Transportation Needs: Not on file   Physical Activity: Not on file  Stress: Not on file  Social Connections: Not on file  Intimate Partner Violence: Not on file     No Known Allergies   Outpatient Medications Prior to Visit  Medication Sig Dispense Refill   levETIRAcetam (KEPPRA XR) 500 MG 24 hr tablet Take 500 mg by mouth at bedtime.     OVER THE COUNTER MEDICATION Take 1 tablet by mouth daily. [facility]     PRESCRIPTION MEDICATION Malaria medication     rosuvastatin (CRESTOR) 10 MG tablet Take 1 tablet (10 mg total) by mouth daily. 30 tablet 2   spironolactone (ALDACTONE) 25 MG tablet Take 25 mg by mouth daily.     torsemide (DEMADEX) 20 MG tablet Take 20 mg by mouth daily.     rivaroxaban (XARELTO) 20 MG TABS tablet Take 1 tablet (20 mg total) by mouth daily with supper. 30 tablet 1   No facility-administered medications prior to visit.    Review of Systems  Constitutional:  Negative for chills, fever, malaise/fatigue and weight loss.  HENT:  Negative for congestion, sinus pain and sore throat.   Eyes: Negative.   Respiratory:  Negative for cough, hemoptysis, sputum production, shortness of breath and wheezing.   Cardiovascular:  Negative for chest pain, palpitations, orthopnea, claudication and leg swelling.  Gastrointestinal:  Negative for abdominal pain, heartburn, nausea and vomiting.  Genitourinary:  Negative.   Musculoskeletal:  Negative for joint pain and myalgias.  Skin:  Negative for rash.  Neurological:  Positive for tingling and sensory change. Negative for weakness.  Endo/Heme/Allergies: Negative.   Psychiatric/Behavioral: Negative.     Objective:   Vitals:   04/08/21 1621  BP: 128/70  Pulse: 84  SpO2: 98%  Weight: 153 lb (69.4 kg)  Height: 5\' 6"  (1.676 m)   Physical Exam Constitutional:      General: He is not in acute distress. HENT:     Head: Normocephalic and atraumatic.  Eyes:     Extraocular Movements: Extraocular movements intact.     Conjunctiva/sclera: Conjunctivae normal.      Pupils: Pupils are equal, round, and reactive to light.  Cardiovascular:     Rate and Rhythm: Normal rate and regular rhythm.     Pulses: Normal pulses.     Heart sounds: Normal heart sounds. No murmur heard. Pulmonary:     Effort: Pulmonary effort is normal.     Breath sounds: Normal breath sounds.  Abdominal:     General: Bowel sounds are normal.     Palpations: Abdomen is soft.  Musculoskeletal:     Right lower leg: No edema.     Left lower leg: No edema.  Lymphadenopathy:     Cervical: No cervical adenopathy.  Skin:    General: Skin is warm and dry.  Neurological:     General: No focal deficit present.     Mental Status: He is alert.  Psychiatric:        Mood and Affect: Mood normal.        Behavior: Behavior normal.        Thought Content: Thought content normal.        Judgment: Judgment normal.    CBC    Component Value Date/Time   WBC 5.8 02/03/2021 0243   RBC 4.19 (L) 02/03/2021 0243   HGB 12.2 (L) 02/03/2021 0243   HCT 36.9 (L) 02/03/2021 0243   PLT 124 (L) 02/03/2021 0243   MCV 88.1 02/03/2021 0243   MCH 29.1 02/03/2021 0243   MCHC 33.1 02/03/2021 0243   RDW 16.9 (H) 02/03/2021 0243   LYMPHSABS 2.1 01/29/2021 2314   MONOABS 0.8 01/29/2021 2314   EOSABS 0.2 01/29/2021 2314   BASOSABS 0.0 01/29/2021 2314   BMP Latest Ref Rng & Units 02/03/2021 02/02/2021 02/01/2021  Glucose 70 - 99 mg/dL 90 04/03/2021) 90  BUN 8 - 23 mg/dL 22 485(I) 62(V)  Creatinine 0.61 - 1.24 mg/dL 03(J) 0.09(F) 8.18(E)  Sodium 135 - 145 mmol/L 135 136 138  Potassium 3.5 - 5.1 mmol/L 4.8 4.2 3.9  Chloride 98 - 111 mmol/L 104 106 107  CO2 22 - 32 mmol/L 24 23 23   Calcium 8.9 - 10.3 mg/dL 9.5 9.9 9.1   Chest imaging: CTA Chest 01/30/21 Pan lobar acute pulmonary emboli with occlusive segmental clots. CT evidence of right heart strain consistent with at least submassive (intermediate risk) PE. The presence of right heart strain has been associated with an increased risk of morbidity and  mortality.    Airspace opacity in the left lung favoring pneumonia.  PFT: No flowsheet data found.  Labs:  Path:  Echo 01/30/21: LV EF 55-60%. RV systolic function is severely reduced. The RV size is moderately enlarged. There is moderately elevated pulmonary artery systolic pressure.  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   He has had clinical improvement since hospitalization for RSV pneumonia and acute segmental/subsegmental pulmonary emboli.  He is anticoagulated with Xarelto therapy  He has bronchiectasis on his CT chest scan. No active symptoms of wheezing or sputum production. We will check pulmonary function tests in 1 month and also check an echo in 1 month to follow up on his right heart function. We will trial him on spiriva inhaler and monitor for any further improvement in his dyspnea.  He is complaining of paresthesias of his lower extremities and has thrombocytopenia. 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 He may benefit from hematology consult in the future.  Follow up in 4 weeks.   Melody Comas, MD [facility] Office: [phone]    Current Outpatient Medications:    levETIRAcetam (KEPPRA XR) 500 MG 24 hr tablet, Take 500 mg by mouth at bedtime., Disp: , Rfl:    OVER THE COUNTER MEDICATION, Take 1 tablet by mouth daily. [facility], Disp: , Rfl:    PRESCRIPTION MEDICATION, Malaria medication, Disp: , Rfl:    rosuvastatin (CRESTOR) 10 MG tablet, Take 1 tablet (10 mg total) by mouth daily., Disp: 30 tablet, Rfl: 2    spironolactone (ALDACTONE) 25 MG tablet, Take 25 mg by mouth daily., Disp: , Rfl:    torsemide (DEMADEX) 20 MG tablet, Take 20 mg by mouth daily., Disp: , Rfl:

## 2021-04-13 ENCOUNTER — Other Ambulatory Visit: Payer: Self-pay | Admitting: Student

## 2021-04-13 ENCOUNTER — Other Ambulatory Visit (HOSPITAL_COMMUNITY): Payer: Self-pay

## 2021-04-13 DIAGNOSIS — I2699 Other pulmonary embolism without acute cor pulmonale: Secondary | ICD-10-CM

## 2021-04-14 ENCOUNTER — Other Ambulatory Visit: Payer: Self-pay | Admitting: Student

## 2021-04-14 ENCOUNTER — Other Ambulatory Visit (HOSPITAL_COMMUNITY): Payer: Self-pay

## 2021-04-14 DIAGNOSIS — I2699 Other pulmonary embolism without acute cor pulmonale: Secondary | ICD-10-CM

## 2021-04-16 ENCOUNTER — Other Ambulatory Visit (HOSPITAL_COMMUNITY): Payer: Self-pay

## 2021-04-16 ENCOUNTER — Other Ambulatory Visit: Payer: Self-pay | Admitting: Student

## 2021-04-16 ENCOUNTER — Encounter (HOSPITAL_COMMUNITY): Payer: Self-pay | Admitting: Pulmonary Disease

## 2021-04-16 DIAGNOSIS — I2699 Other pulmonary embolism without acute cor pulmonale: Secondary | ICD-10-CM

## 2021-04-17 ENCOUNTER — Other Ambulatory Visit (HOSPITAL_COMMUNITY): Payer: Self-pay

## 2021-04-17 MED ORDER — RIVAROXABAN 20 MG PO TABS
20.0000 mg | ORAL_TABLET | Freq: Every day | ORAL | 1 refills | Status: DC
Start: 2021-04-17 — End: 2021-07-07
  Filled 2021-04-17: qty 30, 30d supply, fill #0
  Filled 2021-06-05: qty 30, 30d supply, fill #1

## 2021-04-23 ENCOUNTER — Encounter: Payer: Self-pay | Admitting: Pulmonary Disease

## 2021-04-30 ENCOUNTER — Telehealth (HOSPITAL_COMMUNITY): Payer: Self-pay | Admitting: Pulmonary Disease

## 2021-04-30 NOTE — Telephone Encounter (Signed)
Just an FYI. We have made several attempts to contact this patient including sending a letter to schedule or reschedule their echocardiogram. We will be removing the patient from the echo WQ.  04/16/21 MAILED LETTER [facility]  04/14/21 [facility] to schedule @ 10:27am/[facility] HM# is a hotel??  04/09/21 Called and [facility] to schedule @ 9:39/[facility]      Thank you

## 2021-04-30 NOTE — Telephone Encounter (Signed)
Hi Lisa,   Thank you for the update. Have you tried calling the other contacts listed in his chart?  Thanks again,  [place]

## 2021-05-19 ENCOUNTER — Other Ambulatory Visit: Payer: Self-pay

## 2021-05-19 ENCOUNTER — Ambulatory Visit (INDEPENDENT_AMBULATORY_CARE_PROVIDER_SITE_OTHER): Payer: Self-pay | Admitting: Pulmonary Disease

## 2021-05-19 ENCOUNTER — Encounter: Payer: Self-pay | Admitting: Pulmonary Disease

## 2021-05-19 ENCOUNTER — Other Ambulatory Visit (HOSPITAL_COMMUNITY): Payer: Self-pay

## 2021-05-19 VITALS — BP 126/74 | HR 72 | Temp 98.0°F | Ht 66.0 in | Wt 148.2 lb

## 2021-05-19 DIAGNOSIS — J479 Bronchiectasis, uncomplicated: Secondary | ICD-10-CM

## 2021-05-19 MED ORDER — SPIRIVA RESPIMAT 2.5 MCG/ACT IN AERS
2.0000 | INHALATION_SPRAY | Freq: Every day | RESPIRATORY_TRACT | 11 refills | Status: DC
  Filled 2021-05-19: qty 4, 30d supply, fill #0

## 2021-05-19 NOTE — Patient Instructions (Addendum)
Continue Spiriva 2 puffs daily  Continue xarelto daily, call your primary care team at the [facility] for refill.  Plan to continue this medication for 3 more months

## 2021-05-19 NOTE — Progress Notes (Signed)
Synopsis: Referred in October 2022 for hospital follow up for PE and RSV Pneumonia  Subjective:   Jeffrey Valencia ID: Jeffrey Valencia GENDER: male DOB: 15-Feb-1941, MRN: [id number]  HPI  Chief Complaint  Jeffrey Valencia presents with   Follow-up    Follow up   Jeffrey Valencia is a 80 year old male, former smoker with history of hyperlipidemia, hypertension and thrombocytopenia who comes to pulmonary clinic for follow up of pulmonary emboli and bronchiectasis.   He continues to do well on Xarelto anticoagulation therapy for his pulmonary emboli.  He denies any blood in his stools or urine.  He was provided Spiriva inhaler for his shortness of breath at last visit likely due to bronchiectasis with improvement in his breathing.  He denies any active symptoms of cough, sputum production or wheezing.  He is accompanied with his friend at today's visit.  OV 04/08/21 He was admitted 8/5 to 8/9 for progressive shortness of breath of 5 days found to have RSV pneumonia and multiple segmental and subsegmental pulmonary emboli with some of the segmental emboli being occlusive. TTE showed evidence of significant right heart strain. He was treated with heparin drip and is currently taking xarelto.   He is accompanied by a friend. He is from [place]. He is feeling much better since discharge. He continues to have some shortness of breath on exertion. He also complains of pain on his right side and numbness/tingling of his legs.   Past Medical History:  Diagnosis Date   Hyperlipidemia    Hypertension    Pulmonary embolism (HCC)    Thrombocytopenia (HCC)      No family history on file.   Social History   Socioeconomic History   Marital status: Married    Spouse name: Not on file   Number of children: Not on file   Years of education: Not on file   Highest education level: Not on file  Occupational History   Not on file  Tobacco Use   Smoking status: Former    Types: Cigarettes   Smokeless  tobacco: Never  Substance and Sexual Activity   Alcohol use: Not on file   Drug use: Never   Sexual activity: Never  Other Topics Concern   Not on file  Social History Narrative   Not on file   Social Determinants of Health   Financial Resource Strain: Not on file  Food Insecurity: Not on file  Transportation Needs: Not on file  Physical Activity: Not on file  Stress: Not on file  Social Connections: Not on file  Intimate Partner Violence: Not on file     No Known Allergies   Outpatient Medications Prior to Visit  Medication Sig Dispense Refill   levETIRAcetam (KEPPRA XR) 500 MG 24 hr tablet Take 500 mg by mouth at bedtime.     OVER THE COUNTER MEDICATION Take 1 tablet by mouth daily. [facility]     PRESCRIPTION MEDICATION Malaria medication     rivaroxaban (XARELTO) 20 MG TABS tablet Take 1 tablet (20 mg total) by mouth daily with supper. 30 tablet 1   spironolactone (ALDACTONE) 25 MG tablet Take 25 mg by mouth daily.     Tiotropium Bromide Monohydrate (SPIRIVA RESPIMAT) 2.5 MCG/ACT AERS Inhale 2 puffs into the lungs daily. 8 g 0   torsemide (DEMADEX) 20 MG tablet Take 20 mg by mouth daily.     rosuvastatin (CRESTOR) 10 MG tablet Take 1 tablet (10 mg total) by mouth daily. 30 tablet 2  No facility-administered medications prior to visit.    Review of Systems  Constitutional:  Negative for chills, fever, malaise/fatigue and weight loss.  HENT:  Negative for congestion, sinus pain and sore throat.   Eyes: Negative.   Respiratory:  Negative for cough, hemoptysis, sputum production, shortness of breath and wheezing.   Cardiovascular:  Negative for chest pain, palpitations, orthopnea, claudication and leg swelling.  Gastrointestinal:  Negative for abdominal pain, heartburn, nausea and vomiting.  Genitourinary: Negative.   Musculoskeletal:  Negative for joint pain and myalgias.  Skin:  Negative for rash.  Neurological:  Positive for tingling and sensory change. Negative for  weakness.  Endo/Heme/Allergies: Negative.   Psychiatric/Behavioral: Negative.     Objective:   Vitals:   05/19/21 1209  BP: 126/74  Pulse: 72  Temp: 98 F (36.7 C)  TempSrc: Oral  SpO2: 94%  Weight: 148 lb 3.2 oz (67.2 kg)  Height: 5\' 6"  (1.676 m)   Physical Exam Constitutional:      General: He is not in acute distress. HENT:     Head: Normocephalic and atraumatic.  Eyes:     Conjunctiva/sclera: Conjunctivae normal.  Cardiovascular:     Rate and Rhythm: Normal rate and regular rhythm.     Pulses: Normal pulses.     Heart sounds: Normal heart sounds. No murmur heard. Pulmonary:     Effort: Pulmonary effort is normal.     Breath sounds: Normal breath sounds. No wheezing, rhonchi or rales.  Musculoskeletal:     Right lower leg: No edema.     Left lower leg: No edema.  Skin:    General: Skin is warm and dry.  Neurological:     General: No focal deficit present.     Mental Status: He is alert.    CBC    Component Value Date/Time   WBC 5.8 02/03/2021 0243   RBC 4.19 (L) 02/03/2021 0243   HGB 12.2 (L) 02/03/2021 0243   HCT 36.9 (L) 02/03/2021 0243   PLT 124 (L) 02/03/2021 0243   MCV 88.1 02/03/2021 0243   MCH 29.1 02/03/2021 0243   MCHC 33.1 02/03/2021 0243   RDW 16.9 (H) 02/03/2021 0243   LYMPHSABS 2.1 01/29/2021 2314   MONOABS 0.8 01/29/2021 2314   EOSABS 0.2 01/29/2021 2314   BASOSABS 0.0 01/29/2021 2314   BMP Latest Ref Rng & Units 02/03/2021 02/02/2021 02/01/2021  Glucose 70 - 99 mg/dL 90 04/03/2021) 90  BUN 8 - 23 mg/dL 22 893(T) 34(K)  Creatinine 0.61 - 1.24 mg/dL 87(G) 8.11(X) 7.26(O)  Sodium 135 - 145 mmol/L 135 136 138  Potassium 3.5 - 5.1 mmol/L 4.8 4.2 3.9  Chloride 98 - 111 mmol/L 104 106 107  CO2 22 - 32 mmol/L 24 23 23   Calcium 8.9 - 10.3 mg/dL 9.5 9.9 9.1   Chest imaging: CTA Chest 01/30/21 Pan lobar acute pulmonary emboli with occlusive segmental clots. CT evidence of right heart strain consistent with at least submassive (intermediate risk) PE.  The presence of right heart strain has been associated with an increased risk of morbidity and mortality.    Airspace opacity in the left lung favoring pneumonia.  PFT: No flowsheet data found.  Labs:  Path:  Echo 01/30/21: LV EF 55-60%. RV systolic function is severely reduced. The RV size is moderately enlarged. There is moderately elevated pulmonary artery systolic pressure.  Heart Catheterization:  Assessment & Plan:   Bronchiectasis without complication [facility])  Discussion: Leonte Horrigan is a 80 year old male, former smoker  with history of hyperlipidemia, hypertension and thrombocytopenia who comes to pulmonary clinic for follow up of pulmonary emboli and bronchiectasis.  He continues to have clinical improvement since hospitalization for RSV pneumonia and acute segmental/subsegmental pulmonary emboli.  He is anticoagulated with Xarelto therapy. He has completed 3 months of therapy and wishes to continues 3 more months prior to deciding whether to discontinue this medication. I think given he had PE noted during acute illness, this was provoked and he is safe to discontinue anticoagulation after 6 months of treatment.  He has bronchiectasis on his CT chest scan. No active symptoms of wheezing or sputum production. He is to continue spiriva inhaler therapy as he reported improvement in his breathing.  His internal medicine team was message about getting refills/prescriptions for xarelto and spiriva with their assistance program.   Follow up in 3 months.  Melody Comas, MD [facility] Office: [phone]    Current Outpatient Medications:    levETIRAcetam (KEPPRA XR) 500 MG 24 hr tablet, Take 500 mg by mouth at bedtime., Disp: , Rfl:    OVER THE COUNTER MEDICATION, Take 1 tablet by mouth daily. [facility], Disp: , Rfl:    PRESCRIPTION MEDICATION, Malaria medication, Disp: , Rfl:    rivaroxaban (XARELTO) 20 MG TABS tablet, Take 1 tablet (20 mg total) by  mouth daily with supper., Disp: 30 tablet, Rfl: 1   spironolactone (ALDACTONE) 25 MG tablet, Take 25 mg by mouth daily., Disp: , Rfl:    Tiotropium Bromide Monohydrate (SPIRIVA RESPIMAT) 2.5 MCG/ACT AERS, Inhale 2 puffs into the lungs daily., Disp: 8 g, Rfl: 0   torsemide (DEMADEX) 20 MG tablet, Take 20 mg by mouth daily., Disp: , Rfl:    rosuvastatin (CRESTOR) 10 MG tablet, Take 1 tablet (10 mg total) by mouth daily., Disp: 30 tablet, Rfl: 2

## 2021-05-20 ENCOUNTER — Encounter: Payer: Self-pay | Admitting: Pulmonary Disease

## 2021-05-23 ENCOUNTER — Other Ambulatory Visit: Payer: Self-pay | Admitting: Student

## 2021-05-23 DIAGNOSIS — I10 Essential (primary) hypertension: Secondary | ICD-10-CM

## 2021-05-23 DIAGNOSIS — R569 Unspecified convulsions: Secondary | ICD-10-CM

## 2021-05-23 DIAGNOSIS — E785 Hyperlipidemia, unspecified: Secondary | ICD-10-CM

## 2021-05-23 DIAGNOSIS — J479 Bronchiectasis, uncomplicated: Secondary | ICD-10-CM

## 2021-05-23 MED ORDER — SPIRIVA HANDIHALER 18 MCG IN CAPS
18.0000 ug | ORAL_CAPSULE | Freq: Every day | RESPIRATORY_TRACT | 2 refills | Status: AC
  Filled 2021-05-23: qty 30, 30d supply, fill #0
  Filled 2021-07-03: qty 30, 30d supply, fill #1

## 2021-05-23 MED ORDER — SPIRONOLACTONE 25 MG PO TABS
25.0000 mg | ORAL_TABLET | Freq: Every day | ORAL | 2 refills | Status: AC
Start: 2021-05-23 — End: ?
  Filled 2021-05-23: qty 30, 30d supply, fill #0
  Filled 2021-07-03: qty 30, 30d supply, fill #1

## 2021-05-23 MED ORDER — ROSUVASTATIN CALCIUM 10 MG PO TABS
10.0000 mg | ORAL_TABLET | Freq: Every day | ORAL | 2 refills | Status: AC
  Filled 2021-05-23: qty 30, 30d supply, fill #0
  Filled 2021-07-03: qty 30, 30d supply, fill #1

## 2021-05-23 MED ORDER — LEVETIRACETAM ER 500 MG PO TB24
500.0000 mg | ORAL_TABLET | Freq: Every day | ORAL | 2 refills | Status: AC
  Filled 2021-05-23: qty 30, 30d supply, fill #0
  Filled 2021-07-03: qty 30, 30d supply, fill #1

## 2021-05-23 NOTE — Progress Notes (Signed)
I received a message from Dr. Francine Graven regarding helping patient obtain Spiriva.  Patient does not have insurance.  I called and talked to patient's son.  He agrees with switching to Spiriva HandiHaler, which will be covered under the the IM program.  Patient and son also would like a refill on rosuvastatin, spironolactone and Keppra.   Advised him to call the clinic to make a follow-up appointment.

## 2021-05-25 ENCOUNTER — Other Ambulatory Visit (HOSPITAL_COMMUNITY): Payer: Self-pay

## 2021-05-29 ENCOUNTER — Observation Stay (HOSPITAL_COMMUNITY)
Admission: AD | Admit: 2021-05-29 | Discharge: 2021-05-30 | Disposition: A | Discharge status: Home / Self Care | Payer: Self-pay | Source: Ambulatory Visit | Attending: Internal Medicine | Admitting: Internal Medicine

## 2021-05-29 ENCOUNTER — Other Ambulatory Visit: Payer: Self-pay

## 2021-05-29 ENCOUNTER — Encounter: Payer: Self-pay | Admitting: Student

## 2021-05-29 ENCOUNTER — Ambulatory Visit (INDEPENDENT_AMBULATORY_CARE_PROVIDER_SITE_OTHER): Payer: Self-pay | Admitting: Student

## 2021-05-29 VITALS — BP 148/77 | HR 93 | Temp 97.6°F | Resp 28 | Ht 66.0 in | Wt 145.3 lb

## 2021-05-29 DIAGNOSIS — I129 Hypertensive chronic kidney disease with stage 1 through stage 4 chronic kidney disease, or unspecified chronic kidney disease: Secondary | ICD-10-CM | POA: Insufficient documentation

## 2021-05-29 DIAGNOSIS — Z20822 Contact with and (suspected) exposure to covid-19: Secondary | ICD-10-CM | POA: Insufficient documentation

## 2021-05-29 DIAGNOSIS — R569 Unspecified convulsions: Secondary | ICD-10-CM

## 2021-05-29 DIAGNOSIS — Z79899 Other long term (current) drug therapy: Secondary | ICD-10-CM | POA: Insufficient documentation

## 2021-05-29 DIAGNOSIS — D696 Thrombocytopenia, unspecified: Secondary | ICD-10-CM | POA: Diagnosis present

## 2021-05-29 DIAGNOSIS — I10 Essential (primary) hypertension: Secondary | ICD-10-CM | POA: Diagnosis present

## 2021-05-29 DIAGNOSIS — Z7901 Long term (current) use of anticoagulants: Secondary | ICD-10-CM | POA: Insufficient documentation

## 2021-05-29 DIAGNOSIS — E785 Hyperlipidemia, unspecified: Secondary | ICD-10-CM | POA: Diagnosis present

## 2021-05-29 DIAGNOSIS — G9340 Encephalopathy, unspecified: Secondary | ICD-10-CM | POA: Insufficient documentation

## 2021-05-29 DIAGNOSIS — R4182 Altered mental status, unspecified: Principal | ICD-10-CM | POA: Insufficient documentation

## 2021-05-29 DIAGNOSIS — I2699 Other pulmonary embolism without acute cor pulmonale: Secondary | ICD-10-CM

## 2021-05-29 DIAGNOSIS — R350 Frequency of micturition: Secondary | ICD-10-CM

## 2021-05-29 DIAGNOSIS — N1831 Chronic kidney disease, stage 3a: Secondary | ICD-10-CM | POA: Insufficient documentation

## 2021-05-29 LAB — POCT URINALYSIS DIPSTICK
Bilirubin, UA: NEGATIVE
Blood, UA: NEGATIVE
Glucose, UA: NEGATIVE
Ketones, UA: NEGATIVE
Nitrite, UA: NEGATIVE
Protein, UA: POSITIVE — AB
Spec Grav, UA: 1.025 (ref 1.010–1.025)
Urobilinogen, UA: 0.2 E.U./dL
pH, UA: 6 (ref 5.0–8.0)

## 2021-05-29 MED ORDER — LEVETIRACETAM ER 500 MG PO TB24
500.0000 mg | ORAL_TABLET | Freq: Every day | ORAL | Status: DC
  Administered 2021-05-30: 500 mg via ORAL
  Filled 2021-05-29 (×2): qty 1

## 2021-05-29 MED ORDER — TIOTROPIUM BROMIDE MONOHYDRATE 18 MCG IN CAPS: 18.0000 ug | ORAL_CAPSULE | Freq: Every day | RESPIRATORY_TRACT | Status: DC

## 2021-05-29 MED ORDER — TORSEMIDE 20 MG PO TABS: 20.0000 mg | ORAL_TABLET | Freq: Every day | ORAL | Status: DC

## 2021-05-29 MED ORDER — ROSUVASTATIN CALCIUM 5 MG PO TABS
10.0000 mg | ORAL_TABLET | Freq: Every day | ORAL | Status: DC
  Administered 2021-05-30: 10 mg via ORAL
  Filled 2021-05-29: qty 2

## 2021-05-29 MED ORDER — ACETAMINOPHEN 650 MG RE SUPP: 650.0000 mg | Freq: Four times a day (QID) | RECTAL | Status: DC | PRN

## 2021-05-29 MED ORDER — RIVAROXABAN 20 MG PO TABS: 20.0000 mg | ORAL_TABLET | Freq: Every day | ORAL | Status: DC

## 2021-05-29 MED ORDER — UMECLIDINIUM BROMIDE 62.5 MCG/ACT IN AEPB
1.0000 | INHALATION_SPRAY | Freq: Every day | RESPIRATORY_TRACT | Status: DC
  Filled 2021-05-29: qty 7

## 2021-05-29 MED ORDER — RIVAROXABAN 20 MG PO TABS
20.0000 mg | ORAL_TABLET | Freq: Every day | ORAL | Status: DC
  Administered 2021-05-30: 20 mg via ORAL
  Filled 2021-05-29: qty 1

## 2021-05-29 MED ORDER — SPIRONOLACTONE 25 MG PO TABS
25.0000 mg | ORAL_TABLET | Freq: Every day | ORAL | Status: DC
  Administered 2021-05-30: 25 mg via ORAL
  Filled 2021-05-29: qty 1

## 2021-05-29 MED ORDER — ACETAMINOPHEN 325 MG PO TABS
650.0000 mg | ORAL_TABLET | Freq: Four times a day (QID) | ORAL | Status: DC | PRN
  Administered 2021-05-30: 650 mg via ORAL
  Filled 2021-05-29: qty 2

## 2021-05-29 NOTE — Patient Instructions (Signed)
Jeffrey Valencia,  It was a pleasure seeing you in the clinic today.  I am concerned about your seizure and change in mental status.  We will admit you to the hospital for urgent work-up.    Please keep your phone on when the hospital call you.  Our team will take care of him in the hospital.  Take care,  Dr. Cyndie Chime

## 2021-05-29 NOTE — Assessment & Plan Note (Addendum)
His son brought him here due to concern of change in mental status and his seizure happened 2 weeks ago.  His son stated that starting 3 weeks ago, patient has not been sleeping at night, walking around the house.  He was confused and agitated.  He called his son by a different name.  He also urinated on the kitchen floor.  His mental status is better in the daytime.  His son said the patient does not take naps during daytime.  Since then the patient has become more forgetful recently as well.  Son reports an episode of witnessed seizure 2 weeks ago.  He states that patient was having jerking movement of their right arm.  He thinks that patient was not conscious at that time.  There was no urinary or bowel incontinence.  The episode lasted 1-2 minutes.  It took patient 10 minutes to come back to his normal baseline.  Patient report history of seizure disorder back in [place].  His first seizure episode was 3 years ago.  He then had another episode of seizure last year which he was put on Keppra.  Patient originally stopped taking Keppra when he came to [place] because he no longer had seizure episode.  However in the last 5 months, son reports 4 episode of seizure, which prompted family to resume Keppra.  Son states that he takes care of patient's medication.  Reports adherence to Keppra.  When asked about his mood, his son state that he seems to be more depressed.  When asked patient about his mood, he denies feeling depressed or down.  His main concerns are his joints pain.  Assessment and plan I am concerned about patient's presentation.  Certainly he could have progression of neurocognitive disorder.  However we need to rule out any reversible causes such as vitamin D deficiency, B12 deficiencies, or depression.  I am unable to assess for his mood or perform a MoCA test due to language barrier.  He had a CT head in August that was negative for brain mass but showed chronic microangiopathy.  His  recurrent seizure while on Keppra are also concerning.  His lack of sleep could explain his recent seizure.  However patient need further work-up for his recurrent seizures and follow-up with a neurologist.  Given his lack of insurance, I do not think outpatient work-up will be feasible for him.  We will admit him to the hospital for work-up of his change in mental status and seizure.

## 2021-05-29 NOTE — H&P (Addendum)
Date: 05/30/2021               Patient Name:  Jeffrey Valencia MRN: [id number]  DOB: 1941/06/02 Age / Sex: 80 y.o., male   PCP: Jeffrey Gerold, DO         Medical Service: Internal Medicine Teaching Service         Attending Physician: Dr. Lucious Groves, DO    First Contact: Dr. Vinetta Valencia Pager: [phone]  Second Contact: Dr. Allyson Valencia Pager: [phone]       After Hours (After 5p/  First Contact Pager: [phone]  weekends / holidays): Second Contact Pager: [phone]   Chief Complaint: Change in mental status  History of Present Illness: Jeffrey Valencia is a 80 year old male who was a direct admission from the  [facility] clinic due to recent seizure episode lasting 10 minutes, with a past medical history of  seizure disorder, PE on AG and HTN. [name] speaking. Recently moved from [place] in July. Lives in an extended stay with wife and children. Unfortunately interpreter services did not have an interpreter for [name]. Interview conducted with the assistance of his son Jeffrey Valencia. He has been having pain all over for the past year and a half. Pain in his legs, shoulders and hip joints. Past two months it has been worst. He is also having worsening forgetfulness over the past few months. Difficulty urinating and trouble sleeping. Endorses weakness. He had symptoms of shortness of breath several months ago (august 2022) and found to have a PE.  Patient denies fever, chills, chest pain, shortness of breath, nausea, vomiting, abdominal pain, constipation or diarrhea.  Patient endorses frequent urination but denies dysuria. He recently had a witnessed seizure 2 weeks ago.  He was noted to have right upper extremity jerking movements. Seizure lasted for about 10 minutes. He recovered about 20 mins after. He did not seek help after this seizure. The [facility] regarding this the same day.  The son endorses that patient will have at least 1-2 seizures per month.  Patient states that he does not take his  Keppra medication daily, however, he takes it when he feels like he is going to have a seizure.  No falls during this seizure, he was lying in bed when the seizure occurred. No incontinence during seizure. He did bite his tongue.   Meds:  Keppra 500 mg Xarelto  Spiriva  Spironolactone 25 mg  Rosuvastatin 10 mg  Anti-malarial medication he gets from a friend   No outpatient medications have been marked as taking for the 05/29/21 encounter [facility] Encounter).     Allergies: Allergies as of 05/29/2021   (No Known Allergies)   Past Medical History:  Diagnosis Date   Hyperlipidemia    Hypertension    Pulmonary embolism ([place])    Thrombocytopenia ([place])     Family History: Unknown  Social History:  From [place]; patient can speak partial english, needs Optometrist. Visiting family, recently traveled from [place] to [place] July of 2022. Visits frequently. Former smoker of cigarettes, pack years unknown. Denies illicit drug use.  Review of Systems: A complete ROS was negative except as per HPI.   Physical Exam: Blood pressure 138/76, pulse 70, temperature 98.4 F (36.9 C), resp. rate 17, SpO2 97 %. Physical Exam Constitutional:      General: He is awake. He is not in acute distress. HENT:     Head: Normocephalic and atraumatic.  Eyes:     General: Lids are  normal.  Neck:     Thyroid: No thyromegaly or thyroid tenderness.     Comments: Mild pain with ROM of neck Cardiovascular:     Rate and Rhythm: Normal rate and regular rhythm.     Pulses:          Radial pulses are 2+ on the right side and 2+ on the left side.       Dorsalis pedis pulses are 2+ on the right side and 2+ on the left side.     Heart sounds: Normal heart sounds.  Pulmonary:     Effort: Pulmonary effort is normal.     Breath sounds: Normal breath sounds.  Musculoskeletal:     Right lower leg: 1+ Edema present.     Left lower leg: 1+ Edema present.  Feet:     Comments: Edematous +1  bilaterally Lymphadenopathy:     Cervical:     Right cervical: No superficial or deep cervical adenopathy.    Left cervical: No superficial or deep cervical adenopathy.  Skin:    General: Skin is warm and dry.  Neurological:     Mental Status: He is alert and oriented to person, place, and time.     Cranial Nerves: No dysarthria or facial asymmetry.     Motor: Motor function is intact. No weakness.     Comments: 5 out of 5 strength upper and lower extremities bilaterally.  Psychiatric:        Behavior: Behavior is cooperative.       Assessment & Plan by Problem: Principal Problem:   Change in mental status Active Problems:   Hypertension   Hyperlipidemia   Seizure (HCC)   Stage 3a chronic kidney disease (CKD) ([place])   Change in mental status According to patient's son, whom translated during exam, he reports increased forgetfulness for the last several months.  He also endorsed increased confusion and disorientation.  Son states that the patient has called him a different name.  He also states that patient would have to go to the bathroom but forgets where it is in the home.  Patient states that he does not recall events from the previous day.  Patient also states that he can have a conversation, and then forget what was discussed a few minutes later.  Patient reports frequent urination with occasional incontinence.  Patient states he has difficulty sleeping, this has been ongoing for the past several months.  He states that at night when he cannot sleep he would just be up walking around the house.  Son endorses that patient came into the kitchen and urinated on the kitchen floor.  That occurred once.  On exam, patient was A&Ox3.  He was able to recall his full name, date of birth, and where he was.  His speech was tangible and coherent. Patient answer questions appropriately.  We will obtain UA to rule out infectious cause such as UTI due recent worsening of confusion/disorientation  and associated frequent urination.  Electrolyte imbalances can cause changes in mental status will obtain CMP to evaluate.  Vitamin deficiencies such as vitamin B12 can also result in changes in mental status, will assess vitamin B12 levels. 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  Although very low suspicion, will obtain updated imaging to rule out subacute strokes or mass-effect.  If structural abnormalities are ruled out, consider neurologic etiology. Given old age and progressive confusion and disorientation possible underlying dementia.  Patient can follow-up outpatient  neurology for further evaluation. --CMP pending --Vitamin B12 levels pending --Vitamin D levels pending -- Obtain CBC to rule out infectious cause --PT/OT evaluation --CT head pending   Seizure disorder Patient has a history of seizure disorder that was diagnosed 3 years ago.  Patient was placed on Keppra 500 mg daily.  Patient reports that he takes the seizure medication only when he feels a seizure "coming on".  It was explained to the patient that this medication has to be taken daily.  Patient endorses that he does not take this medication daily at bedtime, only when he feels he needs to.  Patient continues to have recurrent seizures once to twice a month.  Most recent seizure was 2 weeks ago.  This episode was witnessed by his children.  Patient was lying in bed when the seizure occurred.  They state that the seizure was focal to the right upper extremity, jerking movement, that lasted for 10 minutes.  There was no incontinence however tongue biting noted.  After the 10-minute seizure episode, patient was postictal for about 20 minutes.  Son reports that he called a doctor and was told to schedule an appointment.  Son states this is the longest duration of a seizure episode; usually his seizure episodes last a few minutes.  It was reiterated to the son and  patient that the [place] medication is to be taking daily at bedtime.  Further medication education needed for proper adherence. --Restarted on Keppra 500 mg at bedtime --Consulted pharmacy for medication education  Osteoarthritis Patient reports ongoing chronic pain of his "whole body".  He states that this has been going on for several years.  He attributes this ache and pain to old age.  He particularly states that the pain is worse in his knees, hips, and upper back.  On physical exam, 5 out of 5 strength of the upper and lower extremities.  Mild pain noted on range of motion of the neck. CT obtained January 30, 2021 showed no acute fracture or static subluxation of the cervical spine. Due to age and chronicity of described pain.  Most likely due to osteoarthritis of the joints. --Tylenol 650 every 6 hours as needed  CKD stage IIIa Most recent BMP revealed creatinine level of 1.52; baseline appears to be 1.5-1.6.  eGFR less than 50. --Avoid nephrotoxic agents --Obtain BMP  Hypertension Patient home medication includes amlodipine 10 mg daily; spironolactone 25 mg daily; torsemide 10 mg daily as needed for lower extremity edema.  Patient's blood pressure since admission has been acceptable.  We will resume home medications slowly.  On physical exam, trace edema noted in the ankles bilaterally.  Can restart torsemide for lower extremity swelling. --Restart spironolactone 25 mg daily --Restart torsemide 10 mg daily  Hyperlipidemia Home medication includes rosuvastatin 10 mg daily. --Restart home medication tomorrow  Dispo: Admit patient to Observation with expected length of stay less than 2 midnights.  Signed: Timothy Lasso, MD 05/30/2021, 12:58 AM  Pager: [phone] After 5pm on weekdays and 1pm on weekends: On Call pager: [phone]

## 2021-05-29 NOTE — Progress Notes (Signed)
   CC: Seizure, change in mental status  HPI:  [name] is a 80 y.o. with past medical history of pulmonary embolism, hypertension and seizure who presents to the clinic for follow-up on an episode of seizure 2 weeks ago.  Please see problem based charting for detail  Past Medical History:  Diagnosis Date   Hyperlipidemia    Hypertension    Pulmonary embolism (HCC)    Thrombocytopenia (HCC)    Review of Systems:  per HPI  Physical Exam:  Vitals:   05/29/21 1024  BP: (!) 148/77  Pulse: 93  Resp: (!) 28  Temp: 97.6 F (36.4 C)  TempSrc: Oral  SpO2: 97%  Weight: 145 lb 4.8 oz (65.9 kg)  Height: 5\' 6"  (1.676 m)   Physical Exam Constitutional:      General: He is not in acute distress.    Appearance: He is not ill-appearing.     Comments: Patient alert and oriented x4.  He could not remember his birthday  HENT:     Head: Normocephalic.  Eyes:     General:        Right eye: No discharge.        Left eye: No discharge.  Cardiovascular:     Rate and Rhythm: Normal rate and regular rhythm.     Heart sounds: Murmur heard.  Pulmonary:     Effort: Pulmonary effort is normal. No respiratory distress.     Breath sounds: No wheezing.  Musculoskeletal:        General: Normal range of motion.     Cervical back: Normal range of motion.  Skin:    General: Skin is warm.  Neurological:     Mental Status: He is alert.     Comments: Normal strength of bilateral upper and lower extremities.  Normal range of motion.  Psychiatric:        Mood and Affect: Mood normal.        Behavior: Behavior normal.     Assessment & Plan:   See Encounters Tab for problem based charting.  Patient discussed with Dr. 

## 2021-05-30 ENCOUNTER — Observation Stay (HOSPITAL_COMMUNITY): Payer: Self-pay

## 2021-05-30 DIAGNOSIS — G9340 Encephalopathy, unspecified: Secondary | ICD-10-CM

## 2021-05-30 DIAGNOSIS — N1831 Chronic kidney disease, stage 3a: Secondary | ICD-10-CM

## 2021-05-30 LAB — COMPREHENSIVE METABOLIC PANEL
ALT: 13 U/L (ref 0–44)
AST: 20 U/L (ref 15–41)
Albumin: 3.3 g/dL — ABNORMAL LOW (ref 3.5–5.0)
Alkaline Phosphatase: 79 U/L (ref 38–126)
Anion gap: 5 (ref 5–15)
BUN: 19 mg/dL (ref 8–23)
CO2: 26 mmol/L (ref 22–32)
Calcium: 9.1 mg/dL (ref 8.9–10.3)
Chloride: 107 mmol/L (ref 98–111)
Creatinine, Ser: 1.45 mg/dL — ABNORMAL HIGH (ref 0.61–1.24)
GFR, Estimated: 49 mL/min — ABNORMAL LOW (ref >60)
Glucose, Bld: 103 mg/dL — ABNORMAL HIGH (ref 70–99)
Potassium: 4 mmol/L (ref 3.5–5.1)
Sodium: 138 mmol/L (ref 135–145)
Total Bilirubin: 0.4 mg/dL (ref 0.3–1.2)
Total Protein: 6.5 g/dL (ref 6.5–8.1)

## 2021-05-30 LAB — CBC WITH DIFFERENTIAL/PLATELET
Abs Immature Granulocytes: 0.02 10*3/uL (ref 0.00–0.07)
Basophils Absolute: 0.1 10*3/uL (ref 0.0–0.1)
Basophils Relative: 1 %
Eosinophils Absolute: 0.2 10*3/uL (ref 0.0–0.5)
Eosinophils Relative: 3 %
HCT: 36.5 % — ABNORMAL LOW (ref 39.0–52.0)
Hemoglobin: 11.7 g/dL — ABNORMAL LOW (ref 13.0–17.0)
Immature Granulocytes: 0 %
Lymphocytes Relative: 37 %
Lymphs Abs: 2.2 10*3/uL (ref 0.7–4.0)
MCH: 29 pg (ref 26.0–34.0)
MCHC: 32.1 g/dL (ref 30.0–36.0)
MCV: 90.3 fL (ref 80.0–100.0)
Monocytes Absolute: 0.9 10*3/uL (ref 0.1–1.0)
Monocytes Relative: 14 %
Neutro Abs: 2.6 10*3/uL (ref 1.7–7.7)
Neutrophils Relative %: 45 %
Platelets: 149 10*3/uL — ABNORMAL LOW (ref 150–400)
RBC: 4.04 MIL/uL — ABNORMAL LOW (ref 4.22–5.81)
RDW: 15.9 % — ABNORMAL HIGH (ref 11.5–15.5)
WBC: 5.9 10*3/uL (ref 4.0–10.5)
nRBC: 0 % (ref 0.0–0.2)

## 2021-05-30 LAB — URINALYSIS, ROUTINE W REFLEX MICROSCOPIC
Bilirubin Urine: NEGATIVE
Bilirubin, UA: NEGATIVE
Glucose, UA: NEGATIVE
Glucose, UA: NEGATIVE mg/dL
Hgb urine dipstick: NEGATIVE
Ketones, UA: NEGATIVE
Ketones, ur: NEGATIVE mg/dL
Leukocytes,Ua: NEGATIVE
Nitrite, UA: NEGATIVE
Nitrite: NEGATIVE
Protein, ur: NEGATIVE mg/dL
RBC, UA: NEGATIVE
Specific Gravity, UA: 1.023 (ref 1.005–1.030)
Specific Gravity, Urine: 1.023 (ref 1.005–1.030)
Urobilinogen, Ur: 0.2 mg/dL (ref 0.2–1.0)
pH, UA: 6 (ref 5.0–7.5)
pH: 5 (ref 5.0–8.0)

## 2021-05-30 LAB — RESPIRATORY PANEL BY PCR

## 2021-05-30 LAB — MICROSCOPIC EXAMINATION: Casts: NONE SEEN /lpf

## 2021-05-30 LAB — VITAMIN B12: Vitamin B-12: 480 pg/mL (ref 180–914)

## 2021-05-30 LAB — VITAMIN D 25 HYDROXY (VIT D DEFICIENCY, FRACTURES): Vit D, 25-Hydroxy: 17.7 ng/mL — ABNORMAL LOW (ref 30–100)

## 2021-05-30 MED ORDER — TORSEMIDE 20 MG PO TABS
10.0000 mg | ORAL_TABLET | Freq: Every day | ORAL | Status: DC
  Administered 2021-05-30: 10 mg via ORAL
  Filled 2021-05-30: qty 1

## 2021-05-30 NOTE — Evaluation (Addendum)
Physical Therapy Evaluation and Discharge Patient Details Name: Jeffrey Valencia MRN: [id number] DOB: 1941/03/01 Today's Date: 05/30/2021  History of Present Illness  Pt is 80 y/o M presenting from home after recent seizure episode lasting 10 minutes. PMH includes HLD, HTN, PE, thrombocytopenia, seizure disorder, OA, Stage IIIa CKD.  Clinical Impression  Patient evaluated by Physical Therapy with no further acute PT needs identified. Unfortunately, interpreter services were not available for pt language, [name], and could not reach pt son via phone. Pt able to speak and understand limited English, but could not formally assess cognition. Pt noted to have difficulty sequencing at times I.e. needs reminders to dry hands after washing and washed his hands x 2 during session. Pt ambulating hallway distances with no assistive and negotiated steps at a supervision level. Overall, very mild balance deficits noted. Pt donning clothes once back in room. SpO2 93% on RA, HR stable. All education has been completed and the patient has no further questions. No follow-up Physical Therapy or equipment needs. PT is signing off. Thank you for this referral.      Recommendations for follow up therapy are one component of a multi-disciplinary discharge planning process, led by the attending physician.  Recommendations may be updated based on patient status, additional functional criteria and insurance authorization.  Follow Up Recommendations No PT follow up    Assistance Recommended at Discharge PRN  Functional Status Assessment Patient has not had a recent decline in their functional status  Equipment Recommendations  None recommended by PT    Recommendations for Other Services       Precautions / Restrictions Precautions Precautions: Fall Restrictions Weight Bearing Restrictions: No      Mobility  Bed Mobility               General bed mobility comments: In recliner on arrival     Transfers Overall transfer level: Independent Equipment used: None Transfers: Sit to/from Stand Sit to Stand: Supervision                Ambulation/Gait Ambulation/Gait assistance: Supervision Gait Distance (Feet): 200 Feet Assistive device: None Gait Pattern/deviations: Step-through pattern       General Gait Details: Pt taking large step lengths, uneven cadence, but no gross instability noted.  Stairs Stairs: Yes Stairs assistance: Supervision Stair Management: One rail Right Number of Stairs: 3    Wheelchair Mobility    Modified Rankin (Stroke Patients Only)       Balance Overall balance assessment: Needs assistance Sitting-balance support: No upper extremity supported;Feet supported Sitting balance-Leahy Scale: Good     Standing balance support: No upper extremity supported;During functional activity Standing balance-Leahy Scale: Good                               Pertinent Vitals/Pain Pain Assessment: Faces Faces Pain Scale: No hurt Pain Location: L shoulder Pain Descriptors / Indicators: Grimacing;Guarding Pain Intervention(s): Monitored during session    Home Living Family/patient expects to be discharged to:: Other (Comment) (Extended stay) Living Arrangements: Spouse/significant other;Children                 Additional Comments: Obtained from record    Prior Function Prior Level of Function : Patient poor historian/Family not available                     Hand Dominance        Extremity/Trunk Assessment  Upper Extremity Assessment Upper Extremity Assessment: Overall WFL for tasks assessed LUE Deficits / Details: Pt grimacing with L shoulder flexion, hower ROM and MMT WFL.    Lower Extremity Assessment Lower Extremity Assessment: Overall WFL for tasks assessed    Cervical / Trunk Assessment Cervical / Trunk Assessment: Normal  Communication   Communication: Prefers language other than English  ([name])  Cognition Arousal/Alertness: Awake/alert Behavior During Therapy: Impulsive Overall Cognitive Status: Difficult to assess                                 General Comments: Difficult to assess pt cognition as pt speaks limited [place] and family was not available. Pt speaks [name] which is not available through interpreter service. Able to communicate with pt through gestures and simple phrases, although limited.        General Comments      Exercises     Assessment/Plan    PT Assessment Patient does not need any further PT services  PT Problem List Decreased balance;Decreased mobility;Decreased cognition       PT Treatment Interventions      PT Goals (Current goals can be found in the Care Plan section)  Acute Rehab PT Goals Patient Stated Goal: go home PT Goal Formulation: All assessment and education complete, DC therapy    Frequency     Barriers to discharge        Co-evaluation               AM-PAC PT "6 Clicks" Mobility  Outcome Measure Help needed turning from your back to your side while in a flat bed without using bedrails?: None Help needed moving from lying on your back to sitting on the side of a flat bed without using bedrails?: None Help needed moving to and from a bed to a chair (including a wheelchair)?: None Help needed standing up from a chair using your arms (e.g., wheelchair or bedside chair)?: None Help needed to walk in hospital room?: A Little Help needed climbing 3-5 steps with a railing? : A Little 6 Click Score: 22    End of Session   Activity Tolerance: Patient tolerated treatment well Patient left: in chair;with call bell/phone within reach;with chair alarm set Nurse Communication: Mobility status PT Visit Diagnosis: Unsteadiness on feet (R26.81)    Time: [id number] PT Time Calculation (min) (ACUTE ONLY): 22 min   Charges:   PT Evaluation $PT Eval Low Complexity: 1 Low          Wyona Almas, PT,  DPT Acute Rehabilitation Services Pager [phone] Office [phone]   Deno Etienne 05/30/2021, 3:40 PM

## 2021-05-30 NOTE — Discharge Summary (Signed)
Name: Jeffrey Valencia MRN: [id number] DOB: 1940-09-14 80 y.o. PCP: [name], DO  Date of Admission: 05/29/2021  9:32 PM Date of Discharge: 05/30/2021 Attending Physician: Miguel Aschoff, MD  Discharge Diagnosis: 1. 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 Vitamin D Deficiency 3. Hypertension 4. Hyperlipidemia 5. Chronic Kidney Disease  stage IIIa  Discharge Medications: Allergies as of 05/30/2021   No Known Allergies      Medication List     STOP taking these medications    torsemide 20 MG tablet Commonly known as: DEMADEX       TAKE these medications    levETIRAcetam 500 MG 24 hr tablet Commonly known as: KEPPRA XR Take 1 tablet (500 mg total) by mouth at bedtime.   rosuvastatin 10 MG tablet Commonly known as: CRESTOR Take 1 tablet (10 mg total) by mouth daily.   Spiriva HandiHaler 18 MCG inhalation capsule Generic drug: tiotropium Place 1 capsule (18 mcg total) into inhaler and inhale daily.   spironolactone 25 MG tablet Commonly known as: ALDACTONE Take 1 tablet (25 mg total) by mouth daily.   Xarelto 20 MG Tabs tablet Generic drug: rivaroxaban Take 1 tablet (20 mg total) by mouth daily with supper.        Disposition and follow-up:   [name] was discharged from [facility] in Stable condition.  At the hospital follow up visit please address:  1.  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 Patient will be seen at [facility] for hospital follow up early this upcoming week. Educated patient and family on strict adherence to Keppra 500 mg daily. Please continue to educate patient on medication adherence for adequate seizure control. Please schedule pt for outpatient neurology f/u to optimize keppra dosing. Will need neurocognitive evaluation (MOCA testing, etc) at hospital f/u.   Vitamin D Deficiency. Vitamin D level of 17.7. Please start appropriate Vitamin D  supplementation.   2.  Labs / imaging needed at time of follow-up: None required at this time.   3.  Pending labs/ test needing follow-up: Continue following up on Vit D levels.   Follow-up Appointments:  Follow-up Information     [name], DO. Call in 2 day(s).   Specialty: Internal Medicine Why: Someone will call you to set up appointment at the [facility] with Dr. Cyndie Chime. Contact information: [address] [place] [address] [phone]               Please follow up with [facility] in one week and with neurology as soon as possible.    Hospital Course by problem list:  Mr. Jeffrey Valencia is a 80 year old male with a history of PE on Xarelto, HTN, HLD, dementia and seizure disorder who presented to the hospital with a change in his baseline mentation.  1. Acute encephalopathy secondary to seizure disorder-Resolved. In the setting of dementia and simple focal seizure disorder, patient presented to the hospital with a change in his baseline mentation. He appeared confused and disoriented, and family reports he had difficulty remembering names, recalling events from the previous day and remembering conversations more frequently recently. At baseline, he experiences 2 seizures per month for a few minutes causing twitching in his right upper extremity. His last seizure was 2 weeks ago and he had a similar episode for 10 mins and had tongue biting without incontinence. Family reports that pt is non-adherent to his Keppra 500 mg QD and only takes it on  a PRN basis. Pt also complained of urinary frequency. Workup demonstrates negative UA indicating no involvement of infection, CT head without contrast shows no acute intracranial abnormalities, a negative respiratory panel, unremarkable electrolyte levels and vitamin B 12 levels indicating that there are no infectious causes, electrolyte abnormalities or structural abnormalities leading to acute encephalopathy. In the setting of a  3-year history of seizures and likely underlying dementia and having ruled out other acute causes of changes in mentation, patient likely had these changes secondary to seizure disorder.   2. CKD stage IIIa. Pt has a SCr of 1.45 and GFR of 49 which is consistent with his kidney function from previous admissions. Vitamin D levels were noted to be low on this admission which can occur as a result of his CKD.    3. Hypertension. Pt was continued on home regimen of 10 mg amlodipine QD, 25 mg spironolactone QD and 10 mg torsemide QD for LE edema.   4. Hyperlipidemia. Pt was continued ton home regimen of 10 mg rosuvastatin QD.   5. Osteoarthritis. Pt complained of arthritic pain during this admission. In the setting of CKD stage IIIa, nephrotoxic agents were avoided and pt was given 650 mg of Tylenol Q6H.     Discharge HPI: Pt was seen and examine during bedside rounds. We were able to communicate to him very minimally and he did not endorse any complaints a the time. Pt denies abdominal pain or any pain and difficulty breathing.    Discharge Exam:   BP 118/61   Pulse 66   Temp (!) 97.4 F (36.3 C) (Oral)   Resp 14   Ht 5\' 6"  (1.676 m)   Wt 67.6 kg   SpO2 99%   BMI 24.05 kg/m   Physical Exam Constitutional:      General: He is not in acute distress.    Appearance: He is normal weight.  HENT:     Head: Normocephalic and atraumatic.     Nose: Nose normal.     Mouth/Throat:     Mouth: Mucous membranes are moist.     Pharynx: Oropharynx is clear.  Cardiovascular:     Rate and Rhythm: Normal rate and regular rhythm.     Heart sounds: Normal heart sounds. No murmur heard.   No friction rub. No gallop.  Pulmonary:     Effort: Pulmonary effort is normal. No respiratory distress.     Breath sounds: Normal breath sounds. No wheezing, rhonchi or rales.  Abdominal:     General: There is no distension.     Palpations: Abdomen is soft.     Tenderness: There is no abdominal tenderness.   Musculoskeletal:        General: No signs of injury.     Right lower leg: No edema.     Left lower leg: No edema.  Skin:    General: Skin is warm and dry.  Neurological:     General: No focal deficit present.     Mental Status: He is alert and oriented to person, place, and time. Mental status is at baseline.  Psychiatric:        Mood and Affect: Mood normal.        Behavior: Behavior normal.     Pertinent Labs, Studies, and Procedures:   CBC    Component Value Date/Time   WBC 5.9 05/30/2021 0101   RBC 4.04 (L) 05/30/2021 0101   HGB 11.7 (L) 05/30/2021 0101   HCT 36.5 (L) 05/30/2021 0101  PLT 149 (L) 05/30/2021 0101   MCV 90.3 05/30/2021 0101   MCH 29.0 05/30/2021 0101   MCHC 32.1 05/30/2021 0101   RDW 15.9 (H) 05/30/2021 0101   LYMPHSABS 2.2 05/30/2021 0101   MONOABS 0.9 05/30/2021 0101   EOSABS 0.2 05/30/2021 0101   BASOSABS 0.1 05/30/2021 0101     CMP     Component Value Date/Time   NA 138 05/30/2021 0101   K 4.0 05/30/2021 0101   CL 107 05/30/2021 0101   CO2 26 05/30/2021 0101   GLUCOSE 103 (H) 05/30/2021 0101   BUN 19 05/30/2021 0101   CREATININE 1.45 (H) 05/30/2021 0101   CALCIUM 9.1 05/30/2021 0101   PROT 6.5 05/30/2021 0101   ALBUMIN 3.3 (L) 05/30/2021 0101   AST 20 05/30/2021 0101   ALT 13 05/30/2021 0101   ALKPHOS 79 05/30/2021 0101   BILITOT 0.4 05/30/2021 0101   GFRNONAA 49 (L) 05/30/2021 0101     CT HEAD WO CONTRAST (5MM)  Result Date: 05/30/2021 CLINICAL DATA:  Encephalopathy EXAM: CT HEAD WITHOUT CONTRAST TECHNIQUE: Contiguous axial images were obtained from the base of the skull through the vertex without intravenous contrast. COMPARISON:  01/30/2021 FINDINGS: Brain: There is no mass, hemorrhage or extra-axial collection. The size and configuration of the ventricles and extra-axial CSF spaces are normal. The brain parenchyma is normal, without acute or chronic infarction. Vascular: No abnormal hyperdensity of the major intracranial arteries  or dural venous sinuses. No intracranial atherosclerosis. Skull: The visualized skull base, calvarium and extracranial soft tissues are normal. Sinuses/Orbits: No fluid levels or advanced mucosal thickening of the visualized paranasal sinuses. No mastoid or middle ear effusion. The orbits are normal. IMPRESSION: Normal head CT. Electronically Signed   By: Ulyses Jarred M.D.   On: 05/30/2021 00:49     Discharge Instructions: Discharge Instructions     Call MD for:  difficulty breathing, headache or visual disturbances   Complete by: As directed    Call MD for:  extreme fatigue   Complete by: As directed    Call MD for:  persistant dizziness or light-headedness   Complete by: As directed    Call MD for:  persistant nausea and vomiting   Complete by: As directed    Call MD for:  redness, tenderness, or signs of infection (pain, swelling, redness, odor or green/yellow discharge around incision site)   Complete by: As directed    Call MD for:  severe uncontrolled pain   Complete by: As directed    Call MD for:  temperature >100.4   Complete by: As directed    Diet - low sodium heart healthy   Complete by: As directed    Discharge instructions   Complete by: As directed    Mr. Bossie, it was a pleasure taking care of you during your time here. You came in because you had a seizure two weeks ago and have been feeling different. I am glad to say that your workup here was fine.  Please make sure to do the following: 1. Please make sure to take your Keppra every day. Please pick up your refill from the [facility] 2. I will schedule a visit with Dr. Alfonse Spruce at the [facility] early this next week. Someone will call you to set this up. 3. Please pick up your refill of xarelto from the [facility].   Increase activity slowly   Complete by: As directed  Signed: Claudean Kinds MS4 05/30/2021, 2:14 PM   Pager: [phone]   Attestation for  Student Documentation:  I personally was present and performed or re-performed the history, physical exam and medical decision-making activities of this service and have verified that the service and findings are accurately documented in the student's note.  Virl Axe, MD 05/30/2021, 3:00 PM

## 2021-05-30 NOTE — Progress Notes (Signed)
Attempted to call pts son for translation for some admission questions but phone call went to voice mail.Pt also pulled out IV, pt refused to have another one place.

## 2021-05-30 NOTE — Plan of Care (Signed)

## 2021-05-30 NOTE — Progress Notes (Signed)
Patient up repeatedly despite chair alarms and requests from staff to stay seated/ in bed for safety. Patient refusing to sit in chair with alarm, prefers visitor chair, also not willing to get back into bed

## 2021-05-30 NOTE — Progress Notes (Signed)
OT Cancellation Note  Patient Details Name: Si Jachim MRN: [id number] DOB: [birth year]   Cancelled Treatment:    Reason Eval/Treat Not Completed: Patient declined, no reason specified. Pt eating breakfast and requesting OT return later. Upon return, pt eating lunch and again asking OT to return later. Will follow up as able.  Hillard Danker, OT/L  Acute Rehab [phone]  Lenice Llamas 05/30/2021, 11:57 AM

## 2021-05-30 NOTE — Evaluation (Signed)
Occupational Therapy Evaluation Patient Details Name: Jeffrey Valencia MRN: [id number] DOB: Jun 30, 1940 Today's Date: 05/30/2021   History of Present Illness Pt is 80 y/o M presenting from home after recent seizure episode lasting 10 minutes. PMH includes HLD, HTN, PE, thrombocytopenia, seizure disorder, OA, Stage IIIa CKD.   Clinical Impression   Pt presents with decreased balance. Pt currently requiring supervision for safety with ADLs and functional transfers. Difficult to assess pt cognition as pt speaks limited [place] and family was not available (see details below), however pt did not outwardly appear to have significant cognitive deficits. Pt appeared to intimate that some shoulder discomfort was the only issue he was having. Suspect pt is near baseline and should be safe to return home with family without any further skilled OT services. Will sign off.      Recommendations for follow up therapy are one component of a multi-disciplinary discharge planning process, led by the attending physician.  Recommendations may be updated based on patient status, additional functional criteria and insurance authorization.   Follow Up Recommendations  No OT follow up    Assistance Recommended at Discharge PRN  Functional Status Assessment  Patient has had a recent decline in their functional status and demonstrates the ability to make significant improvements in function in a reasonable and predictable amount of time.  Equipment Recommendations  None recommended by OT    Recommendations for Other Services       Precautions / Restrictions Precautions Precautions: Fall Restrictions Weight Bearing Restrictions: No      Mobility Bed Mobility               General bed mobility comments: In recliner on arrival    Transfers Overall transfer level: Needs assistance   Transfers: Sit to/from Stand Sit to Stand: Supervision                  Balance Overall balance  assessment: Needs assistance Sitting-balance support: No upper extremity supported;Feet supported Sitting balance-Leahy Scale: Good     Standing balance support: No upper extremity supported;During functional activity Standing balance-Leahy Scale: Fair                             ADL either performed or assessed with clinical judgement   ADL Overall ADL's : Needs assistance/impaired Eating/Feeding: Independent   Grooming: Supervision/safety;Standing   Upper Body Bathing: Supervision/ safety;Standing   Lower Body Bathing: Supervison/ safety;Sit to/from stand   Upper Body Dressing : Supervision/safety   Lower Body Dressing: Supervision/safety;Sit to/from stand   Toilet Transfer: Retail banker;Ambulation   Toileting- Clothing Manipulation and Hygiene: Independent       Functional mobility during ADLs: Min guard       Vision   Vision Assessment?: No apparent visual deficits     Perception     Praxis      Pertinent Vitals/Pain Pain Assessment: Faces Faces Pain Scale: Hurts little more Pain Location: L shoulder Pain Descriptors / Indicators: Grimacing;Guarding Pain Intervention(s): Monitored during session     Hand Dominance     Extremity/Trunk Assessment Upper Extremity Assessment Upper Extremity Assessment: Overall WFL for tasks assessed;LUE deficits/detail LUE Deficits / Details: Pt grimacing with L shoulder flexion, hower ROM and MMT WFL.   Lower Extremity Assessment Lower Extremity Assessment: Defer to PT evaluation   Cervical / Trunk Assessment Cervical / Trunk Assessment: Normal   Communication Communication Communication: Prefers language other than English   Cognition Arousal/Alertness:  Awake/alert Behavior During Therapy: Impulsive Overall Cognitive Status: Difficult to assess                                 General Comments: Difficult to assess pt cognition as pt speaks limited [place] and family  was not available. Pt speaks [name] which is not available through interpreter service. Able to communicate with pt through gestures and simple phrases, although limited.     General Comments       Exercises     Shoulder Instructions      Home Living Family/patient expects to be discharged to:: Other (Comment) (Extended stay) Living Arrangements: Spouse/significant other;Children                               Additional Comments: Obtained from record      Prior Functioning/Environment Prior Level of Function : Patient poor historian/Family not available                        OT Problem List: Impaired balance (sitting and/or standing)      OT Treatment/Interventions:      OT Goals(Current goals can be found in the care plan section) Acute Rehab OT Goals Patient Stated Goal: none stated OT Goal Formulation: With patient  OT Frequency:     Barriers to D/C:            Co-evaluation              AM-PAC OT "6 Clicks" Daily Activity     Outcome Measure Help from another person eating meals?: None Help from another person taking care of personal grooming?: A Little Help from another person toileting, which includes using toliet, bedpan, or urinal?: A Little Help from another person bathing (including washing, rinsing, drying)?: A Little Help from another person to put on and taking off regular upper body clothing?: A Little Help from another person to put on and taking off regular lower body clothing?: A Little 6 Click Score: 19   End of Session Equipment Utilized During Treatment: Gait belt Nurse Communication: Mobility status  Activity Tolerance: Patient tolerated treatment well Patient left: in chair;with call bell/phone within reach;with chair alarm set  OT Visit Diagnosis: Unsteadiness on feet (R26.81)                Time: [id number] OT Time Calculation (min): 17 min Charges:  OT General Charges $OT Visit: 1 Visit OT Evaluation $OT  Eval Low Complexity: 1 Low  Tarisha Fader C, OT/L  Acute Rehab [place] 05/30/2021, 2:15 PM

## 2021-05-30 NOTE — Plan of Care (Signed)

## 2021-06-01 NOTE — Progress Notes (Signed)
Internal Medicine Clinic Attending  Case discussed with Dr. Nguyen  At the time of the visit.  We reviewed the resident's history and exam and pertinent patient test results.  I agree with the assessment, diagnosis, and plan of care documented in the resident's note. 

## 2021-06-04 ENCOUNTER — Ambulatory Visit (HOSPITAL_COMMUNITY)
Admission: RE | Admit: 2021-06-04 | Discharge: 2021-06-04 | Disposition: A | Discharge status: Home / Self Care | Payer: Self-pay | Source: Ambulatory Visit | Attending: Internal Medicine | Admitting: Internal Medicine

## 2021-06-04 ENCOUNTER — Encounter: Payer: Self-pay | Admitting: Student

## 2021-06-04 ENCOUNTER — Ambulatory Visit (INDEPENDENT_AMBULATORY_CARE_PROVIDER_SITE_OTHER): Payer: Self-pay | Admitting: Student

## 2021-06-04 ENCOUNTER — Other Ambulatory Visit (HOSPITAL_COMMUNITY): Payer: Self-pay

## 2021-06-04 ENCOUNTER — Other Ambulatory Visit: Payer: Self-pay

## 2021-06-04 VITALS — BP 138/77 | HR 74 | Temp 97.8°F | Wt 147.2 lb

## 2021-06-04 DIAGNOSIS — M25512 Pain in left shoulder: Secondary | ICD-10-CM | POA: Insufficient documentation

## 2021-06-04 DIAGNOSIS — I10 Essential (primary) hypertension: Secondary | ICD-10-CM

## 2021-06-04 DIAGNOSIS — F5101 Primary insomnia: Secondary | ICD-10-CM

## 2021-06-04 DIAGNOSIS — G8929 Other chronic pain: Secondary | ICD-10-CM | POA: Insufficient documentation

## 2021-06-04 DIAGNOSIS — I2609 Other pulmonary embolism with acute cor pulmonale: Secondary | ICD-10-CM

## 2021-06-04 DIAGNOSIS — R569 Unspecified convulsions: Secondary | ICD-10-CM

## 2021-06-04 DIAGNOSIS — R4182 Altered mental status, unspecified: Secondary | ICD-10-CM

## 2021-06-04 DIAGNOSIS — F320 Major depressive disorder, single episode, mild: Secondary | ICD-10-CM

## 2021-06-04 DIAGNOSIS — E559 Vitamin D deficiency, unspecified: Secondary | ICD-10-CM

## 2021-06-04 MED ORDER — FLUOXETINE HCL 20 MG PO CAPS
20.0000 mg | ORAL_CAPSULE | Freq: Every day | ORAL | 2 refills | Status: DC
Start: 2021-06-04 — End: 2021-07-21
  Filled 2021-06-04: qty 30, 30d supply, fill #0
  Filled 2021-07-03: qty 30, 30d supply, fill #1

## 2021-06-04 MED ORDER — TRAZODONE HCL 50 MG PO TABS
50.0000 mg | ORAL_TABLET | Freq: Every day | ORAL | 2 refills | Status: DC
Start: 2021-06-04 — End: 2021-07-21
  Filled 2021-06-04: qty 30, 30d supply, fill #0
  Filled 2021-07-03: qty 30, 30d supply, fill #1

## 2021-06-04 NOTE — Patient Instructions (Addendum)
Jeffrey Valencia,   It was a pleasure seeing you in the clinic today.  Here is a summary what we talked about:  1.  Memory issue: We have ruled out many reversible causes of his memory issue.  He is at risk for depression.  We will start a medication called fluoxetine which can help with his mood and possibly his muscle pain.  We added a medication called trazodone to help him sleep.  Please take 1 tablet at bedtime daily.  2.  Left shoulder pain: I will get an x-ray of his shoulder.  3.  Vitamin D deficiency: Please obtain vitamin D supplement over-the-counter.  Try to get the D3 1000 units daily.  He will start taking it for the next 3 months.  Please return in 4 - 6 weeks to reassess his mood.   Take care,  Dr. Cyndie Chime

## 2021-06-04 NOTE — Assessment & Plan Note (Signed)
Last vitamin D of 17.7.  -He can obtain vitamin D over-the-counter, recommend D3 1000 units daily.

## 2021-06-04 NOTE — Assessment & Plan Note (Signed)
-   Advised patient to take his Keppra consistently

## 2021-06-04 NOTE — Assessment & Plan Note (Signed)
Patient finished 1 month of Eliquis and 2 more months of Xarelto.

## 2021-06-04 NOTE — Assessment & Plan Note (Signed)
Patient report chronic left shoulder pain, especially after his last seizure.  Report limited range of motion due to pain.  -Obtain x-ray.

## 2021-06-04 NOTE — Progress Notes (Signed)
   CC: Hospital follow-up.  Memory issue  HPI:  [name] is a 80 y.o. with past medical history of provoked PE, hypertension who presents to clinic for hospital follow-up of his memory issue.  Please see problem based charting for detail  Past Medical History:  Diagnosis Date   Hyperlipidemia    Hypertension    Pulmonary embolism (HCC)    Thrombocytopenia (HCC)    Review of Systems: Per HPI  Physical Exam:  Vitals:   06/04/21 0941 06/04/21 0942  BP:  138/77  Pulse:  74  Temp:  97.8 F (36.6 C)  TempSrc:  Oral  SpO2:  97%  Weight: 147 lb 3.2 oz (66.8 kg)    Physical Exam Constitutional:      General: He is not in acute distress. HENT:     Head: Normocephalic.  Eyes:     General:        Right eye: No discharge.        Left eye: No discharge.     Conjunctiva/sclera: Conjunctivae normal.  Cardiovascular:     Rate and Rhythm: Normal rate and regular rhythm.  Pulmonary:     Effort: Pulmonary effort is normal. No respiratory distress.     Breath sounds: Normal breath sounds. No wheezing.  Musculoskeletal:        General: Normal range of motion.     Comments: Pain to palpation of bilateral shoulder, bicep muscle, bilateral knees, bilateral calf muscle, bilateral ankles.  Limited range of motion of left shoulder due to pain  Skin:    General: Skin is warm.  Neurological:     Mental Status: He is alert.  Psychiatric:        Behavior: Behavior normal.     Assessment & Plan:   See Encounters Tab for problem based charting.  Patient discussed with Dr. Antony Contras

## 2021-06-04 NOTE — Assessment & Plan Note (Signed)
Blood pressure well controlled.  Continue spironolactone 25 mg

## 2021-06-04 NOTE — Assessment & Plan Note (Addendum)
Patient report progressive worsening of his memory.  He is not sleeping at night.  Son states that he is more withdrawn, isolated and communicate less.  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  Said that this change may affect him.  Son states that he is worried a lot about his memory issue.  He also reports widespread pain of his joints and muscle.  He also report that patient is often distracted when watching TV.  He often ask what we doing while watching TV.  So that he now having issue of explaining things or convey a message.  He was seen in the hospital for his change in mental status.  CT head was unremarkable.  B12 was normal.  Vitamin D was slightly low at 17.  Infectious work-up including UA and respiratory panel were negative.  Assessment and plan Fortunately, many reversible causes of his cognitive issue were ruled out in the hospital.  We attempted a PHQ-9 with help of his son.  We could not complete the questionnaire due to language barrier.  However I do have high suspicion for depression given the sudden change in environment, symptoms of withdrawn and isolation.  His son also thinks that he may have depression.   Fibromyalgia is also differential given his symptoms of diffuse muscle pain for 1 year, in combination with cognitive and sleeping issue.  MOCA test is unreliable given language barrier  -Will start Prozac 20 mg daily.  -Follow-up in 4 to 6 weeks, can uptitrate medications -Start trazodone 50 mg at bedtime for insomnia

## 2021-06-05 ENCOUNTER — Other Ambulatory Visit (HOSPITAL_COMMUNITY): Payer: Self-pay

## 2021-06-08 NOTE — Progress Notes (Signed)
Internal Medicine Clinic Attending  Case discussed with Dr. Nguyen  At the time of the visit.  We reviewed the resident's history and exam and pertinent patient test results.  I agree with the assessment, diagnosis, and plan of care documented in the resident's note. 

## 2021-07-03 ENCOUNTER — Other Ambulatory Visit: Payer: Self-pay | Admitting: Internal Medicine

## 2021-07-03 ENCOUNTER — Other Ambulatory Visit (HOSPITAL_COMMUNITY): Payer: Self-pay

## 2021-07-03 ENCOUNTER — Telehealth: Payer: Self-pay | Admitting: Student

## 2021-07-03 ENCOUNTER — Other Ambulatory Visit: Payer: Self-pay | Admitting: *Deleted

## 2021-07-03 DIAGNOSIS — I2699 Other pulmonary embolism without acute cor pulmonale: Secondary | ICD-10-CM

## 2021-07-03 NOTE — Telephone Encounter (Addendum)
Call to [facility].  Patient has refills on all  meds except Xarelto.  Request sent to Dr. Cyndie Chime.  Call to [facility] spoke to [facility] information that patient has completed his course of Xarelto and does not need a refill.

## 2021-07-03 NOTE — Telephone Encounter (Signed)
MED REFILL REQUEST  Patient states he need refill on all of his medicines to be called into  [facility] Phone:  [phone]  Fax:  [phone]

## 2021-07-06 ENCOUNTER — Other Ambulatory Visit (HOSPITAL_COMMUNITY): Payer: Self-pay

## 2021-07-07 ENCOUNTER — Other Ambulatory Visit (HOSPITAL_COMMUNITY): Payer: Self-pay

## 2021-07-20 ENCOUNTER — Telehealth: Payer: Self-pay | Admitting: *Deleted

## 2021-07-20 ENCOUNTER — Other Ambulatory Visit (HOSPITAL_COMMUNITY): Payer: Self-pay

## 2021-07-20 NOTE — Telephone Encounter (Signed)
Pt's son here at the office - stated he's the pt's caregiver and needs paperwork completed. He needs to take a class but b/c he's the primary caregiver needs to be on-line; stated he needs a letter to be approved to take this class on-line. Also he has a form which needs to be completed by this week. I will place form in [facility] Team's box.His telephone # [phone] for any questions.

## 2021-07-21 ENCOUNTER — Other Ambulatory Visit: Payer: Self-pay

## 2021-07-21 ENCOUNTER — Ambulatory Visit (INDEPENDENT_AMBULATORY_CARE_PROVIDER_SITE_OTHER): Payer: Self-pay | Admitting: Student

## 2021-07-21 ENCOUNTER — Other Ambulatory Visit (HOSPITAL_COMMUNITY): Payer: Self-pay

## 2021-07-21 ENCOUNTER — Encounter: Payer: Self-pay | Admitting: Student

## 2021-07-21 VITALS — BP 142/64 | HR 86 | Temp 97.8°F | Ht 66.0 in | Wt 149.9 lb

## 2021-07-21 DIAGNOSIS — N400 Enlarged prostate without lower urinary tract symptoms: Secondary | ICD-10-CM

## 2021-07-21 DIAGNOSIS — R569 Unspecified convulsions: Secondary | ICD-10-CM

## 2021-07-21 DIAGNOSIS — M170 Bilateral primary osteoarthritis of knee: Secondary | ICD-10-CM

## 2021-07-21 DIAGNOSIS — F320 Major depressive disorder, single episode, mild: Secondary | ICD-10-CM

## 2021-07-21 DIAGNOSIS — M62838 Other muscle spasm: Secondary | ICD-10-CM

## 2021-07-21 DIAGNOSIS — F32A Depression, unspecified: Secondary | ICD-10-CM

## 2021-07-21 DIAGNOSIS — I10 Essential (primary) hypertension: Secondary | ICD-10-CM

## 2021-07-21 MED ORDER — CYCLOBENZAPRINE HCL 5 MG PO TABS
2.5000 mg | ORAL_TABLET | Freq: Every day | ORAL | 0 refills | Status: AC
  Filled 2021-07-21: qty 7, 14d supply, fill #0

## 2021-07-21 MED ORDER — FLUOXETINE HCL 20 MG PO CAPS
40.0000 mg | ORAL_CAPSULE | Freq: Every day | ORAL | 3 refills | Status: AC
  Filled 2021-07-21: qty 60, 30d supply, fill #0

## 2021-07-21 MED ORDER — TAMSULOSIN HCL 0.4 MG PO CAPS
0.4000 mg | ORAL_CAPSULE | Freq: Every day | ORAL | 5 refills | Status: AC
  Filled 2021-07-21: qty 30, 30d supply, fill #0

## 2021-07-21 NOTE — Patient Instructions (Addendum)
For your depression we will increase the dose of your prozac to 40mg  daily. I will reach out to the pharmacy to see why there was an issue.   For your shoulder pain, it is likely related to a muscle spasm of your shoulder. I will prescribe a short course of a muscle relaxant to be taken at night time only to see if this helps. Please also use ice and heating pad as this may also help.   Please use over the counter voltaren gel for your knees.    Please call with any questions or concerns.

## 2021-07-23 DIAGNOSIS — M1990 Unspecified osteoarthritis, unspecified site: Secondary | ICD-10-CM | POA: Insufficient documentation

## 2021-07-23 DIAGNOSIS — N400 Enlarged prostate without lower urinary tract symptoms: Secondary | ICD-10-CM | POA: Insufficient documentation

## 2021-07-23 DIAGNOSIS — F32A Depression, unspecified: Secondary | ICD-10-CM | POA: Insufficient documentation

## 2021-07-23 NOTE — Assessment & Plan Note (Signed)
Patient reports persistent L shoulder pain. He states that the pain starts towards lateral aspect of L neck at the trapezius muscle and he has a numbness that travels down his arm. On exam he had 5/5 LUE strength, grip strength was intact. Trapezius muscle was TTP most prominently in the superior medial portion. Negative empty can test, no pain with external or internal rotation of L shoulder  A/P: Patient's pain likely related to trapezius muscle strain or cramps. Also considered rotator cuff pathology. Discussed with patient to continue to use his muscle cream but that we would trial him on a muscle relaxant only at night for 2 weeks to see if this helps his symptoms. Communicating via his son he is okay with this plan.

## 2021-07-23 NOTE — Progress Notes (Signed)
Internal Medicine Clinic Attending  Case discussed with Dr. Carter  At the time of the visit.  We reviewed the resident's history and exam and pertinent patient test results.  I agree with the assessment, diagnosis, and plan of care documented in the resident's note.  

## 2021-07-23 NOTE — Assessment & Plan Note (Signed)
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 He reports that he does not sleep well because he goes to the restroom frequently. His aldactone was changed to day time but his symptoms have persisted. He notes feeling that his bladder is completely emptied but have a sensation that he has to use the restroom again. He sometimes has a decreased stream.   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 Discussed with son the importance of obtaining orange card for his father.  -Prescribed flomax

## 2021-07-23 NOTE — Assessment & Plan Note (Signed)
Patient has completed 3 months of AC. Denies SOB or pain in lower extremities.

## 2021-07-23 NOTE — Assessment & Plan Note (Signed)
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 Discussed with patient and his son that they could use a medicine called voltaren gel which may offer him some relief.

## 2021-07-23 NOTE — Assessment & Plan Note (Signed)
Poorly controlled. Patient's son states that he ran out of prozac and has not taken the medication for 2 weeks. Denies SI or HI.  -Increased dose to 40mg  daily, patient will follow up in 4 weeks.

## 2021-07-23 NOTE — Assessment & Plan Note (Signed)
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

## 2021-07-23 NOTE — Progress Notes (Signed)
° °  CC: f/u of chronic medical problems  HPI:  Jeffrey Valencia Single is a 81 y.o. M with PMH per below who presents for f/u of his chronic medical problems as well as L shoulder pain. Please see problem based charting under encounters tab for further details.    Past Medical History:  Diagnosis Date   Hyperlipidemia    Hypertension    Pulmonary embolism ([place])    Thrombocytopenia ([place])    Review of Systems:  Please see problem based charting under encounters tab for further details.    Physical Exam:  Vitals:   07/21/21 1119  BP: (!) 142/64  Pulse: 86  Temp: 97.8 F (36.6 C)  TempSrc: Oral  SpO2: 98%  Weight: 149 lb 14.4 oz (68 kg)  Height: 5\' 6"  (1.676 m)   Constitutional: Thin and in no distress.  HENT:  Head: Normocephalic and atraumatic.  Eyes: EOM are normal.  Neck: Normal range of motion.  Cardiovascular: Normal rate, regular rhythm, intact distal pulses. No gallop and no friction rub.  No murmur heard. No lower extremity edema  Pulmonary: Non labored breathing on room air, no wheezing or rales  Abdominal: Soft. Normal bowel sounds. Non distended and non tender Musculoskeletal: Normal range of motion.        General: No tenderness or edema.  Neurological: Alert and oriented to person, place, and time. 5/5 strength of BUE/BLE. L trapezius TTP diffusely most pronounced at superior medial aspect. Negative empty can test, no pain elicited with external or internal rotation at left shoulder  Skin: Skin is warm and dry.    Assessment & Plan:   See Encounters Tab for problem based charting.  Patient discussed with Dr. Evette Doffing

## 2021-07-23 NOTE — Assessment & Plan Note (Signed)
BP 142/64 this clinic visit. Patient's son states that his blood pressure are higher at home as they were leaving the room. Discussed with patient and his son that they should check his blood pressure daily and record it and follow up with [place] in 2-4 weeks. At that time we will make a determination regarding whether to add an additional antihypertensive.

## 2021-08-18 ENCOUNTER — Other Ambulatory Visit: Payer: Self-pay

## 2021-08-18 DIAGNOSIS — I10 Essential (primary) hypertension: Secondary | ICD-10-CM

## 2021-08-18 DIAGNOSIS — R569 Unspecified convulsions: Secondary | ICD-10-CM

## 2021-08-18 DIAGNOSIS — E785 Hyperlipidemia, unspecified: Secondary | ICD-10-CM

## 2021-08-18 DIAGNOSIS — M62838 Other muscle spasm: Secondary | ICD-10-CM

## 2021-08-18 DIAGNOSIS — N400 Enlarged prostate without lower urinary tract symptoms: Secondary | ICD-10-CM

## 2021-08-20 ENCOUNTER — Telehealth: Payer: Self-pay | Admitting: Internal Medicine

## 2021-08-26 DEATH — deceased

## 2021-10-19 ENCOUNTER — Other Ambulatory Visit (HOSPITAL_COMMUNITY): Payer: Self-pay

## 2021-11-30 ENCOUNTER — Other Ambulatory Visit (HOSPITAL_COMMUNITY): Payer: Self-pay

## 2021-11-30 ENCOUNTER — Other Ambulatory Visit: Payer: Self-pay

## 2021-12-01 ENCOUNTER — Other Ambulatory Visit (HOSPITAL_COMMUNITY): Payer: Self-pay

## 2021-12-01 ENCOUNTER — Other Ambulatory Visit: Payer: Self-pay

## 2021-12-02 ENCOUNTER — Other Ambulatory Visit (HOSPITAL_COMMUNITY): Payer: Self-pay

## 2021-12-04 ENCOUNTER — Other Ambulatory Visit (HOSPITAL_COMMUNITY): Payer: Self-pay

## 2022-04-20 ENCOUNTER — Other Ambulatory Visit (HOSPITAL_COMMUNITY): Payer: Self-pay

## 2022-10-04 ENCOUNTER — Other Ambulatory Visit: Payer: Self-pay

## 2023-03-16 IMAGING — CT CT HEAD W/O CM
4 series · 15 of 47 positions shown, 17 images · non-contrast
Comparison: 01/30/2021

CLINICAL DATA: Encephalopathy

EXAM:
CT HEAD WITHOUT CONTRAST
TECHNIQUE: Contiguous axial images were obtained from the base of the skull
through the vertex without intravenous contrast.

[Series 3: head wo · axial · 0.41mm/px · z∈[-151,-36]mm · 7 of 31 slices shown, 9 images]
[im 4/31  brain]
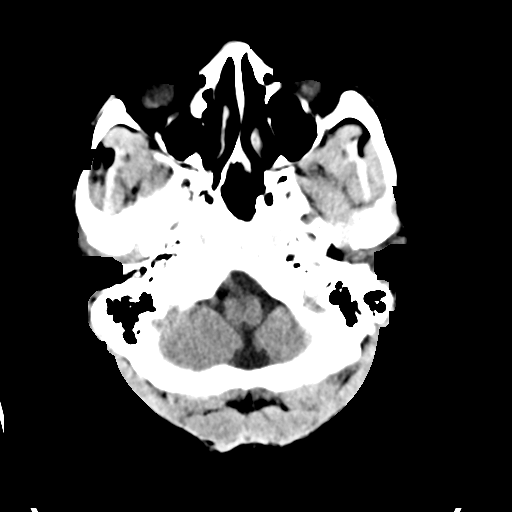
[im 4/31  bone]
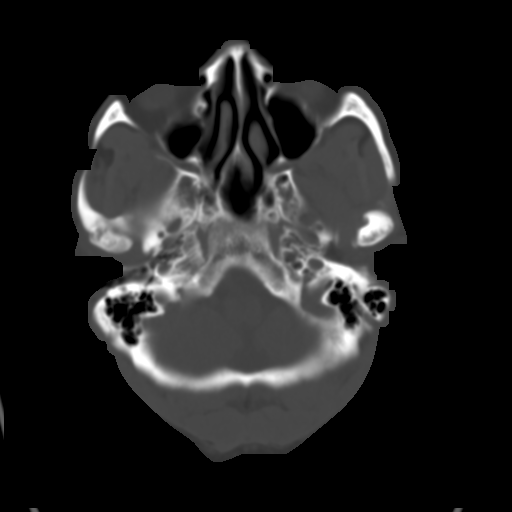
[im 8/31  brain]
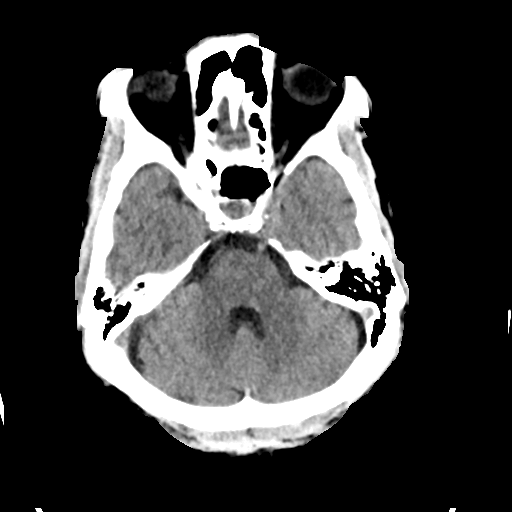
[im 12/31  brain]
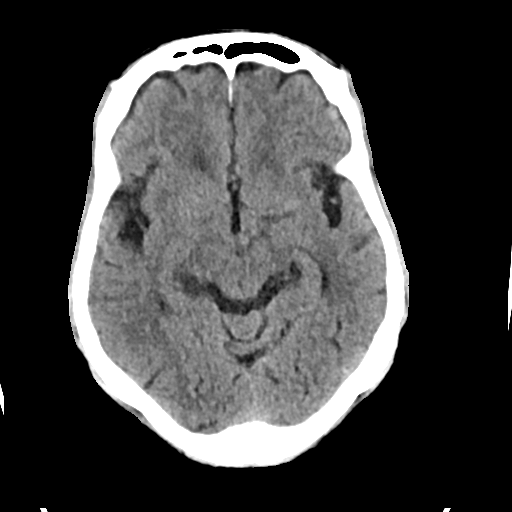
[im 16/31  brain]
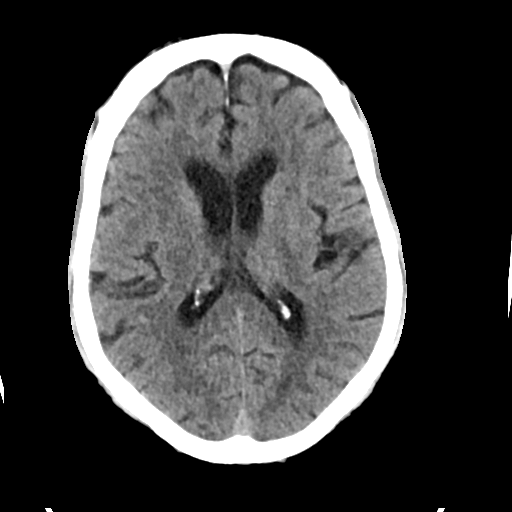
[im 19/31  brain]
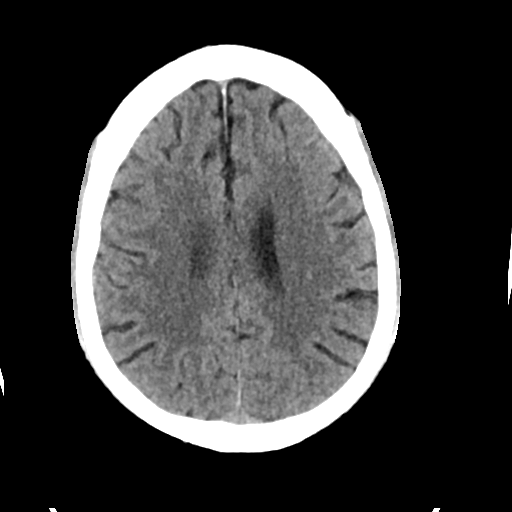
[im 19/31  bone]
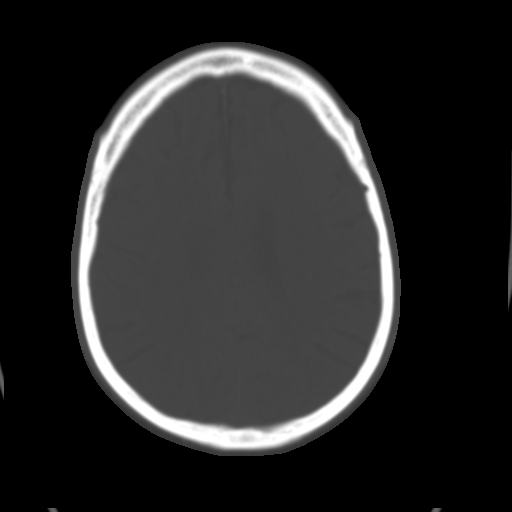
[im 23/31  brain]
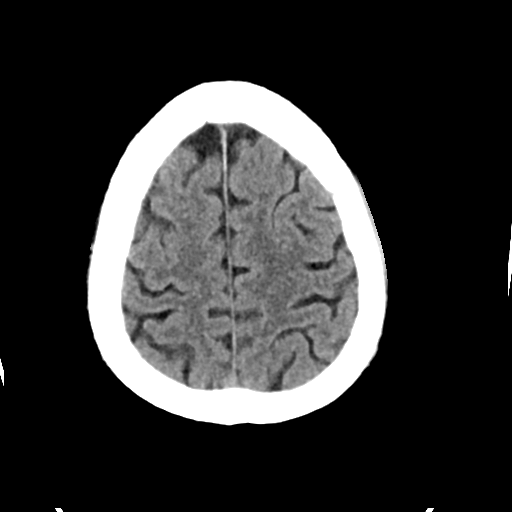
[im 27/31  brain]
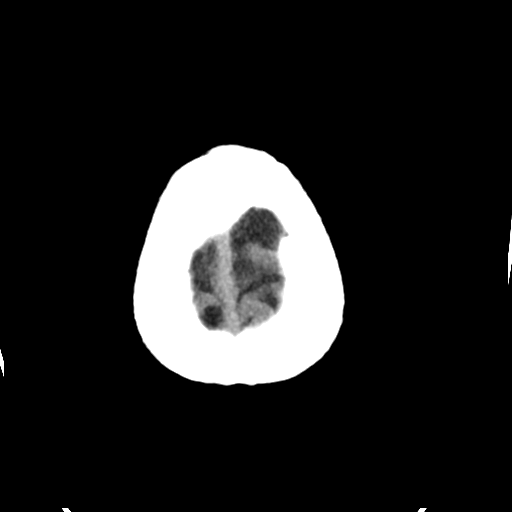

[Series 4: head bone · axial · 0.41mm/px · z∈[-152,-136]mm · 2 of 76 slices shown]
[im 8/76  bone]
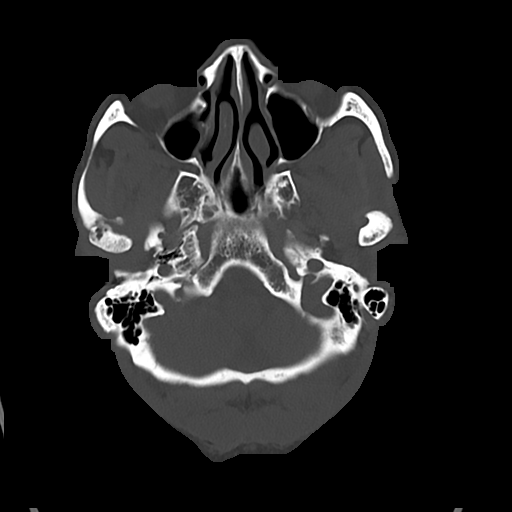
[im 16/76  bone]
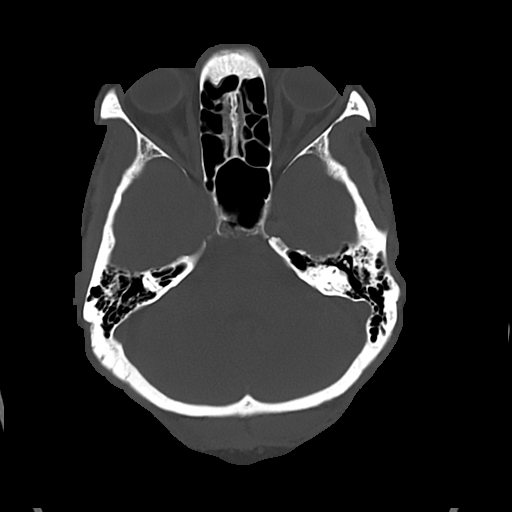

[Series 5: cor soft · coronal · 0.26mm/px · 3 of 70 slices shown]
[im 24/70  brain]
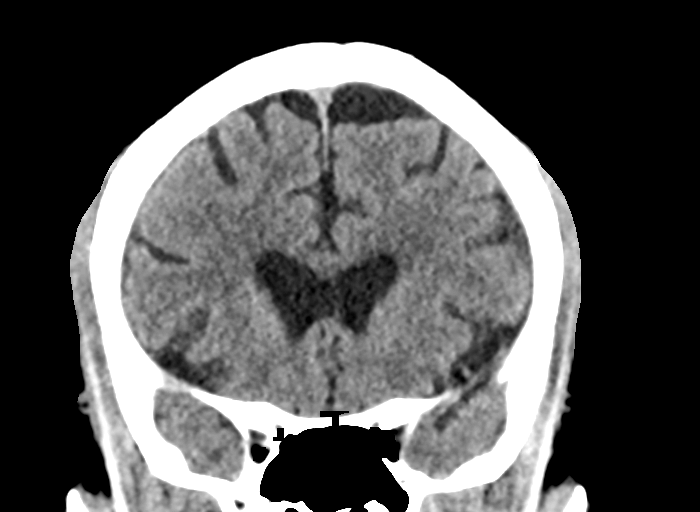
[im 31/70  brain]
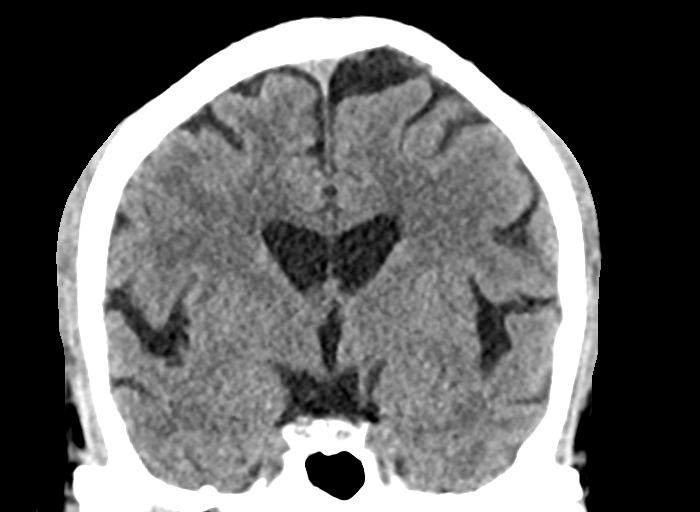
[im 39/70  brain]
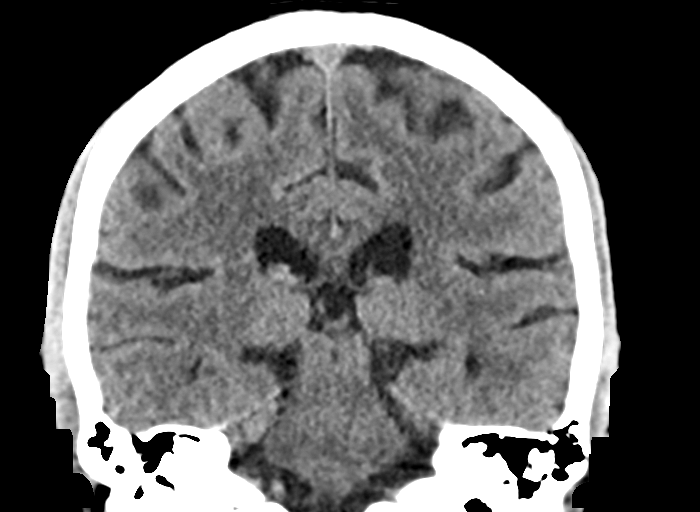

[Series 6: sag soft · sagittal · 0.25mm/px · 3 of 60 slices shown]
[im 20/60  brain]
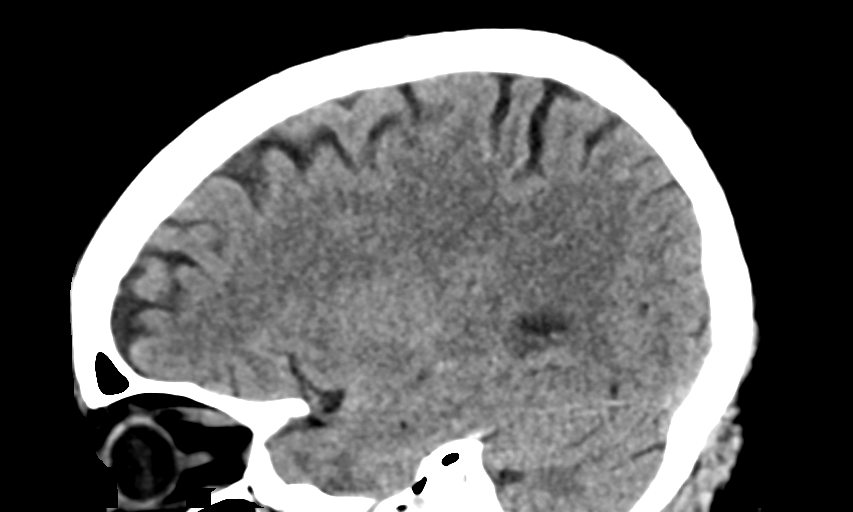
[im 30/60  brain]
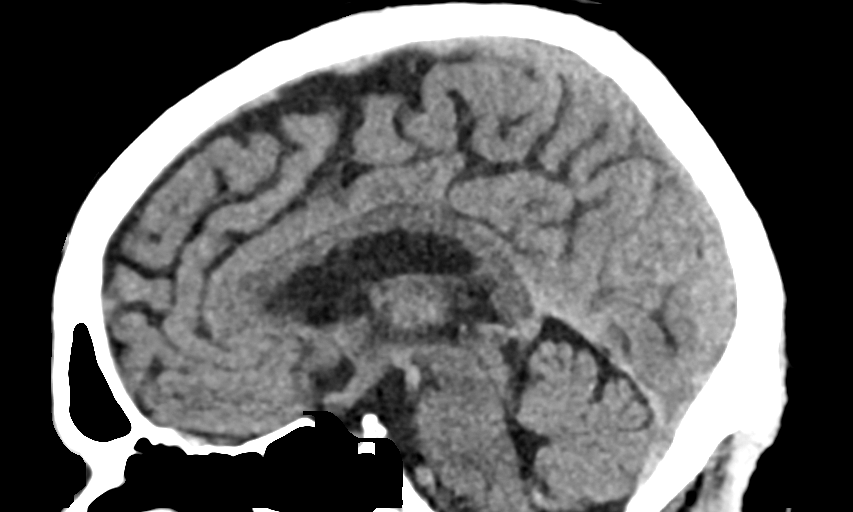
[im 40/60  brain]
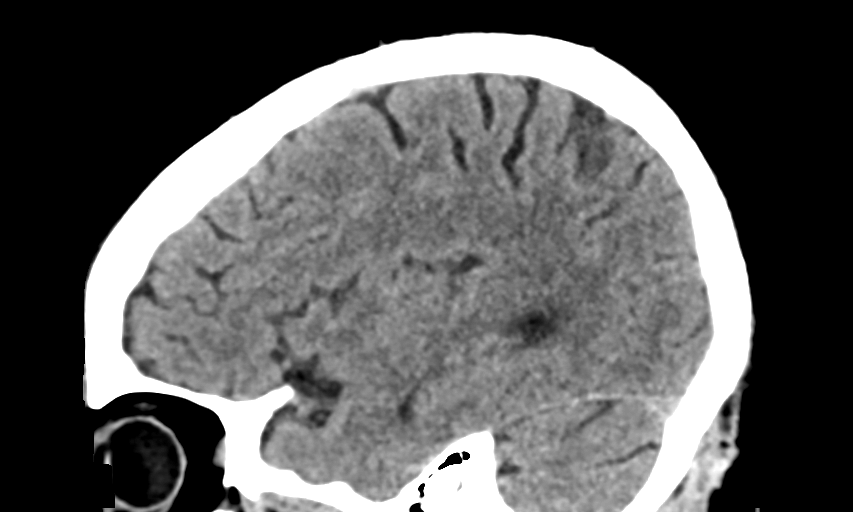

[15 of 47 positions shown; findings below may reference images not displayed]

FINDINGS: Brain: There is no mass, hemorrhage or extra-axial collection. The
size and configuration of the ventricles and extra-axial CSF spaces
are normal. The brain parenchyma is normal, without acute or chronic
infarction.

Vascular: No abnormal hyperdensity of the major intracranial
arteries or dural venous sinuses. No intracranial atherosclerosis.

Skull: The visualized skull base, calvarium and extracranial soft
tissues are normal.

Sinuses/Orbits: No fluid levels or advanced mucosal thickening of
the visualized paranasal sinuses. No mastoid or middle ear effusion.
The orbits are normal.
IMPRESSION: Normal head CT.

## 2023-03-21 IMAGING — DX DG SHOULDER 2+V*L*
3 series · 3 of 3 positions shown · non-contrast
Comparison: None.

CLINICAL DATA: Shoulder pain.

EXAM:
LEFT SHOULDER - 2+ VIEW

[shoulder grashey]
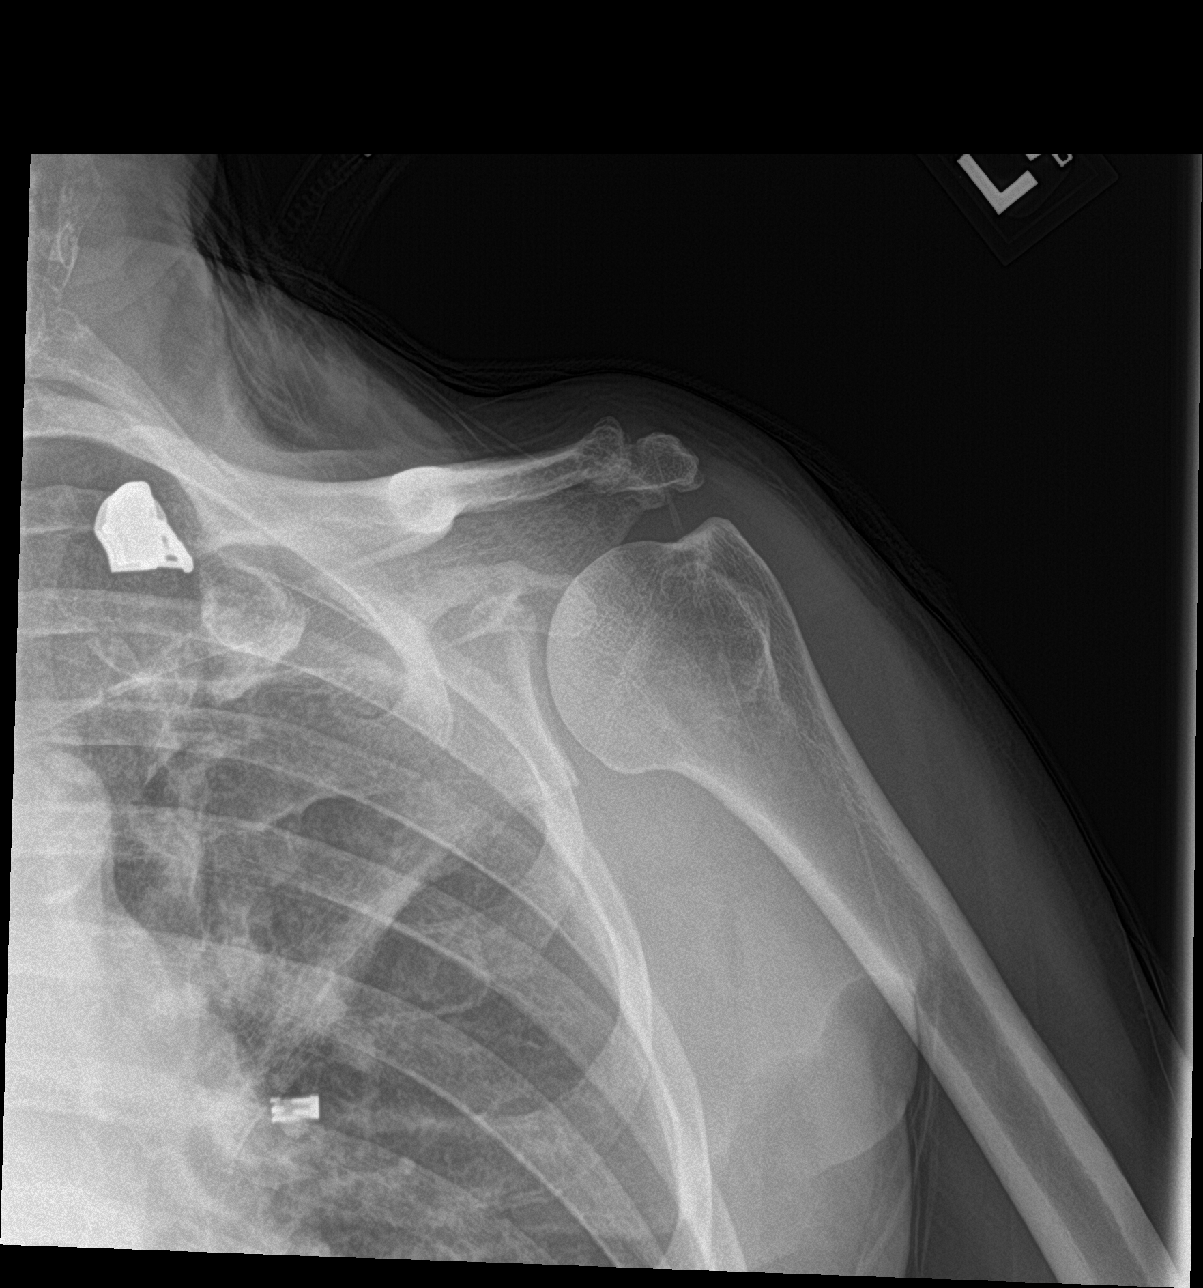

[shoulder y view]
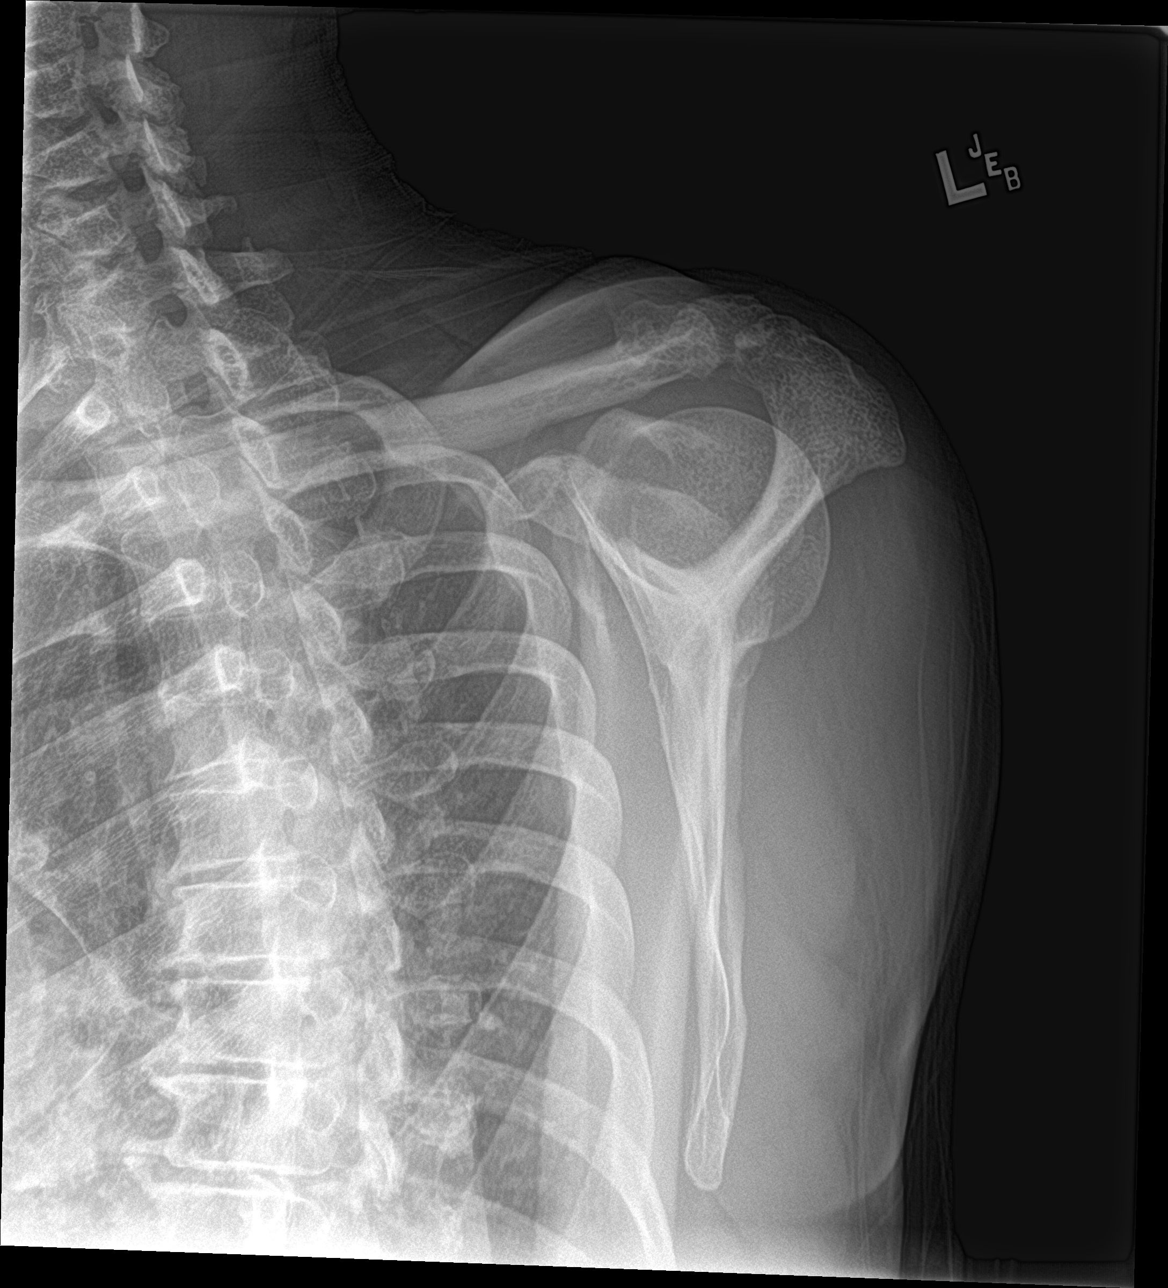

[shoulder axillary]
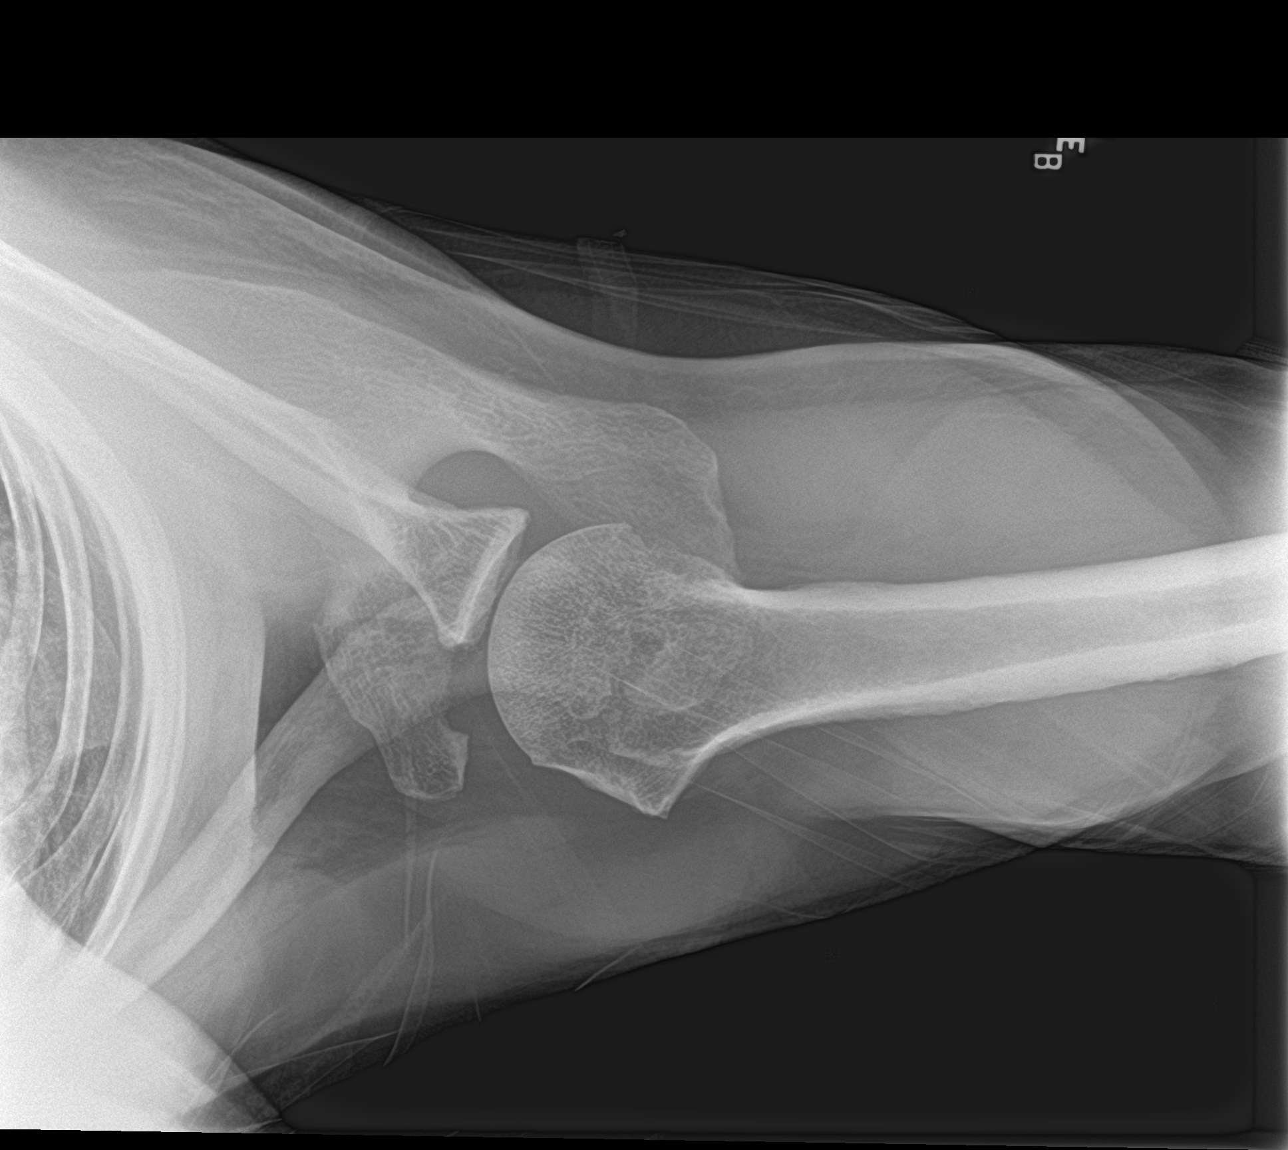

[3 of 3 positions shown; findings below may reference images not displayed]

FINDINGS: There is no evidence of fracture or dislocation. There are mild
degenerative changes of the acromioclavicular joint. Soft tissues
are unremarkable.
IMPRESSION: Negative.

## 2023-09-14 ENCOUNTER — Other Ambulatory Visit: Payer: Self-pay

## 2023-11-02 ENCOUNTER — Other Ambulatory Visit: Payer: Self-pay

## 2024-03-05 ENCOUNTER — Other Ambulatory Visit: Payer: Self-pay
